# Patient Record
Sex: Male | Born: 1985 | Race: Black or African American | Hispanic: No | Marital: Single | Smoking: Never smoker
Health system: Southern US, Community
[De-identification: ages and names within clinical notes are randomized; demographics above are authoritative.]

## PROBLEM LIST (undated history)

## (undated) DIAGNOSIS — M549 Dorsalgia, unspecified: Secondary | ICD-10-CM

## (undated) DIAGNOSIS — M5126 Other intervertebral disc displacement, lumbar region: Secondary | ICD-10-CM

## (undated) DIAGNOSIS — H00019 Hordeolum externum unspecified eye, unspecified eyelid: Secondary | ICD-10-CM

## (undated) HISTORY — PX: MANDIBLE FRACTURE SURGERY: SHX706

---
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
## 2004-01-01 ENCOUNTER — Emergency Department (HOSPITAL_COMMUNITY): Admission: EM | Admit: 2004-01-01 | Discharge: 2004-01-01 | Payer: Self-pay | Admitting: Family Medicine

## 2004-10-19 ENCOUNTER — Emergency Department (HOSPITAL_COMMUNITY): Admission: EM | Admit: 2004-10-19 | Discharge: 2004-10-19 | Payer: Self-pay | Admitting: *Deleted

## 2005-08-13 ENCOUNTER — Emergency Department (HOSPITAL_COMMUNITY): Admission: EM | Admit: 2005-08-13 | Discharge: 2005-08-13 | Payer: Self-pay | Admitting: Family Medicine

## 2006-06-09 ENCOUNTER — Observation Stay (HOSPITAL_COMMUNITY): Admission: AC | Admit: 2006-06-09 | Discharge: 2006-06-10 | Payer: Self-pay

## 2016-08-27 ENCOUNTER — Emergency Department (HOSPITAL_COMMUNITY)
Admission: EM | Admit: 2016-08-27 | Discharge: 2016-08-27 | Disposition: A | Discharge status: Home / Self Care | Payer: Self-pay | Attending: Emergency Medicine | Admitting: Emergency Medicine

## 2016-08-27 ENCOUNTER — Encounter (HOSPITAL_COMMUNITY): Payer: Self-pay | Admitting: Emergency Medicine

## 2016-08-27 DIAGNOSIS — M5441 Lumbago with sciatica, right side: Secondary | ICD-10-CM | POA: Insufficient documentation

## 2016-08-27 DIAGNOSIS — Z79899 Other long term (current) drug therapy: Secondary | ICD-10-CM | POA: Insufficient documentation

## 2016-08-27 HISTORY — DX: Dorsalgia, unspecified: M54.9

## 2016-08-27 MED ORDER — METHOCARBAMOL 500 MG PO TABS: 500.0000 mg | ORAL_TABLET | Freq: Two times a day (BID) | ORAL | 0 refills | Status: DC

## 2016-08-27 MED ORDER — DEXAMETHASONE SODIUM PHOSPHATE 10 MG/ML IJ SOLN
10.0000 mg | Freq: Once | INTRAMUSCULAR | Status: AC
  Administered 2016-08-27: 10 mg via INTRAMUSCULAR
  Filled 2016-08-27: qty 1

## 2016-08-27 MED ORDER — METHOCARBAMOL 500 MG PO TABS
500.0000 mg | ORAL_TABLET | Freq: Once | ORAL | Status: AC
Start: 2016-08-27 — End: 2016-08-27
  Administered 2016-08-27: 500 mg via ORAL
  Filled 2016-08-27: qty 1

## 2016-08-27 MED ORDER — METHYLPREDNISOLONE 4 MG PO TBPK: ORAL_TABLET | ORAL | 0 refills | Status: DC

## 2016-08-27 NOTE — Discharge Instructions (Signed)
Please read and follow all provided instructions.  Your diagnoses today include:  1. Acute right-sided low back pain with right-sided sciatica     Tests performed today include: Vital signs - see below for your results today  Medications prescribed:   Take any prescribed medications only as directed.  Home care instructions:  Follow any educational materials contained in this packet Please rest, use ice or heat on your back for the next several days Do not lift, push, pull anything more than 10 pounds for the next week  Follow-up instructions: Please follow-up with your primary care provider in the next 1 week for further evaluation of your symptoms.   Return instructions:  SEEK IMMEDIATE MEDICAL ATTENTION IF YOU HAVE: New numbness, tingling, weakness, or problem with the use of your arms or legs Severe back pain not relieved with medications Loss control of your bowels or bladder Increasing pain in any areas of the body (such as chest or abdominal pain) Shortness of breath, dizziness, or fainting.  Worsening nausea (feeling sick to your stomach), vomiting, fever, or sweats Any other emergent concerns regarding your health   Additional Information:  Your vital signs today were: BP 125/80 (BP Location: Right Arm)    Pulse 76    Temp 98.3 F (36.8 C)    Resp 18    SpO2 100%  If your blood pressure (BP) was elevated above 135/85 this visit, please have this repeated by your doctor within one month. --------------

## 2016-08-27 NOTE — ED Provider Notes (Signed)
[facility]-EMERGENCY DEPT Provider Note    By signing my name below, I, Alexander Mullins, attest that this documentation has been prepared under the direction and in the presence of Audry Piliyler Rees Matura, PA-C. Electronically Signed: Earmon PhoenixJennifer Mullins, ED Scribe. 08/27/16. 11:10 AM.    History   Chief Complaint Chief Complaint  Patient presents with  . Back Pain    HPI  Alexander Mullins is a 31 y.o. male who presents to the Emergency Department complaining of low back pain, right side greater than left, that began about two weeks ago. He reports associated pain that radiates from his lower back down the lateral RLE. He reports playing basketball and exacerbating the pain. He also reports moving from [place] around the same time which also exacerbated the pain. He states he initially hurt his back two years ago while playing basketball. He states he has had several episodes of back pain since. Pt states he was seen in the ED in [place] two weeks ago and received an injection of Decadron and course of Prednisone (finished about one week ago) which he states helped his pain. He reports sneezing and states all the pain returned. He has taken a Vicodin he received from his wife for pain with moderate relief. Movements increase his pain. He denies alleviating factors. He denies numbness, tingling or weakness of the lower extremities, bowel or bladder incontinence, saddle anesthesia. He states he has seen a chiropractor in the past and states he was told he had sciatica. He does not have a PCP or insurance currently.   Past Medical History:  Diagnosis Date  . Back pain     There are no active problems to display for this patient.   Past Surgical History:  Procedure Laterality Date  . MANDIBLE FRACTURE SURGERY       Home Medications    Prior to Admission medications   Medication Sig Start Date End Date Taking? Authorizing Provider  methocarbamol (ROBAXIN) 500 MG tablet Take 1 tablet (500 mg total) by mouth 2  (two) times daily. 08/27/16   Audry Piliyler Cassiopeia Florentino, PA-C  methylPREDNISolone (MEDROL DOSEPAK) 4 MG TBPK tablet Take as written 08/27/16   Audry Piliyler Elaura Calix, PA-C    Family History History reviewed. No pertinent family history.  Social History Social History  Substance Use Topics  . Smoking status: Not on file  . Smokeless tobacco: Not on file  . Alcohol use Not on file     Allergies   Patient has no allergy information on record.   Review of Systems Review of Systems  Constitutional: Negative for chills and fever.  Gastrointestinal:       No bowel or bladder incontinence  Musculoskeletal: Positive for back pain.  Neurological: Negative for weakness and numbness.     Physical Exam Updated Vital Signs BP 125/80 (BP Location: Right Arm)   Pulse 76   Temp 98.3 F (36.8 C)   Resp 18   SpO2 100%   Physical Exam  Constitutional: He is oriented to person, place, and time. Vital signs are normal. He appears well-developed and well-nourished.  HENT:  Head: Normocephalic and atraumatic.  Right Ear: Hearing normal.  Left Ear: Hearing normal.  Eyes: Conjunctivae and EOM are normal. Pupils are equal, round, and reactive to light.  Neck: Normal range of motion. Neck supple.  Cardiovascular: Normal rate and regular rhythm.   Pulmonary/Chest: Effort normal.  Musculoskeletal: Normal range of motion. He exhibits tenderness. He exhibits no edema or deformity.  Tenderness to palpation to right lower  back. No midline spine tenderness.  Neurological: He is alert and oriented to person, place, and time.  Skin: Skin is warm and dry.  Psychiatric: He has a normal mood and affect. His speech is normal and behavior is normal. Thought content normal.  Nursing note and vitals reviewed.  ED Treatments / Results  DIAGNOSTIC STUDIES: Oxygen Saturation is 100% on RA, normal by my interpretation.   COORDINATION OF CARE: 11:01 AM- Will treat with Medrol dose pack, Decadron injection, Motrin and Robaxin and  give referral to [facility] to establish care with a PCP. Encouraged pt to apply heat to area of pain. Pt verbalizes understanding and agrees to plan.  Medications  dexamethasone (DECADRON) injection 10 mg (not administered)  methocarbamol (ROBAXIN) tablet 500 mg (not administered)    Labs (all labs ordered are listed, but only abnormal results are displayed) Labs Reviewed - No data to display  EKG  EKG Interpretation None       Radiology No results found.  Procedures Procedures (including critical care time)  Medications Ordered in ED Medications  dexamethasone (DECADRON) injection 10 mg (not administered)  methocarbamol (ROBAXIN) tablet 500 mg (not administered)     Initial Impression / Assessment and Plan / ED Course  I have reviewed the triage vital signs and the nursing notes.  Pertinent labs & imaging results that were available during my care of the patient were reviewed by me and considered in my medical decision making (see chart for details).     Final Clinical Impressions(s) / ED Diagnoses  I have reviewed the relevant previous healthcare records. I obtained HPI from historian.  ED Course:  Assessment: Patient is a 30yM who presents to the ED with back pain x 2 weeks. Hx same. Seen in past for same and treated with steroids for likely herniated disc. No neurological deficits appreciated. Patient is ambulatory. No warning symptoms of back pain including: fecal incontinence, urinary retention or overflow incontinence, night sweats, waking from sleep with back pain, unexplained fevers or weight loss, h/o cancer, IVDU, recent trauma. No concern for cauda equina, epidural abscess, or other serious cause of back pain. Likely scaitic back pain. Treated with medrol dose pack and robaxin. Conservative measures such as rest, ice/heat and pain medicine indicated with PCP follow-up if no improvement with conservative management.   Disposition/Plan:  D/C  home Additional Verbal discharge instructions given and discussed with patient.  Pt Instructed to f/u with  and Wellness in the next week for evaluation and treatment of symptoms. Return precautions given Pt acknowledges and agrees with plan  Supervising Physician Charlynne Pander, MD  Final diagnoses:  Acute right-sided low back pain with right-sided sciatica   I personally performed the services described in this documentation, which was scribed in my presence. The recorded information has been reviewed and is accurate.    New Prescriptions New Prescriptions   METHOCARBAMOL (ROBAXIN) 500 MG TABLET    Take 1 tablet (500 mg total) by mouth 2 (two) times daily.   METHYLPREDNISOLONE (MEDROL DOSEPAK) 4 MG TBPK TABLET    Take as written     Audry Pili, PA-C 08/27/16 1115    Charlynne Pander, MD 08/27/16 [phone]

## 2016-08-27 NOTE — ED Triage Notes (Signed)
Pt c/o recurrent low back pain radiating down r/leg. Pt stated that he sneezed, 2 weeks ago. He reports sharp pain radiating down the r/leg. Pain unrelieved by OTC meds. Took 1 percocet last night that decreased pain slightly. Pt is unable to walk due to pain

## 2016-12-26 ENCOUNTER — Other Ambulatory Visit (HOSPITAL_COMMUNITY): Payer: Self-pay | Admitting: Nurse Practitioner

## 2016-12-26 DIAGNOSIS — M5416 Radiculopathy, lumbar region: Secondary | ICD-10-CM

## 2016-12-30 ENCOUNTER — Ambulatory Visit (HOSPITAL_COMMUNITY)
Admission: RE | Admit: 2016-12-30 | Discharge: 2016-12-30 | Disposition: A | Discharge status: Home / Self Care | Payer: Self-pay | Source: Ambulatory Visit | Attending: Nurse Practitioner | Admitting: Nurse Practitioner

## 2016-12-30 DIAGNOSIS — M5416 Radiculopathy, lumbar region: Secondary | ICD-10-CM

## 2016-12-30 DIAGNOSIS — M5126 Other intervertebral disc displacement, lumbar region: Secondary | ICD-10-CM | POA: Insufficient documentation

## 2016-12-30 DIAGNOSIS — M4726 Other spondylosis with radiculopathy, lumbar region: Secondary | ICD-10-CM | POA: Insufficient documentation

## 2016-12-30 DIAGNOSIS — M5116 Intervertebral disc disorders with radiculopathy, lumbar region: Secondary | ICD-10-CM | POA: Insufficient documentation

## 2017-02-01 ENCOUNTER — Encounter (INDEPENDENT_AMBULATORY_CARE_PROVIDER_SITE_OTHER): Payer: Self-pay | Admitting: Physician Assistant

## 2017-02-01 ENCOUNTER — Ambulatory Visit (INDEPENDENT_AMBULATORY_CARE_PROVIDER_SITE_OTHER): Payer: Self-pay | Admitting: Physician Assistant

## 2017-02-01 VITALS — BP 117/76 | HR 54 | Temp 98.2°F | Wt 129.2 lb

## 2017-02-01 DIAGNOSIS — M5137 Other intervertebral disc degeneration, lumbosacral region: Secondary | ICD-10-CM

## 2017-02-01 DIAGNOSIS — G8929 Other chronic pain: Secondary | ICD-10-CM

## 2017-02-01 DIAGNOSIS — M5441 Lumbago with sciatica, right side: Secondary | ICD-10-CM

## 2017-02-01 MED ORDER — GABAPENTIN 300 MG PO CAPS: 300.0000 mg | ORAL_CAPSULE | Freq: Three times a day (TID) | ORAL | 3 refills | Status: DC

## 2017-02-01 MED ORDER — KETOROLAC TROMETHAMINE 60 MG/2ML IM SOLN
60.0000 mg | Freq: Once | INTRAMUSCULAR | Status: AC
  Administered 2017-02-01: 60 mg via INTRAMUSCULAR

## 2017-02-01 MED ORDER — PREDNISONE 20 MG PO TABS: 60.0000 mg | ORAL_TABLET | Freq: Every day | ORAL | 0 refills | Status: DC

## 2017-02-01 MED FILL — GABAPENTIN 300 MG CAPSULE: 300 | 30 days supply | Qty: 90 | Fill #0

## 2017-02-01 MED FILL — ?PREDNISONE 20MG TABLET: 20 | 5 days supply | Qty: 15 | Fill #0

## 2017-02-01 NOTE — Patient Instructions (Signed)
Degenerative Disk Disease Degenerative disk disease is a condition caused by the changes that occur in spinal disks as you grow older. Spinal disks are soft and compressible disks located between the bones of your spine (vertebrae). These disks act like shock absorbers. Degenerative disk disease can affect the whole spine. However, the neck and lower back are most commonly affected. Many changes can occur in the spinal disks with aging, such as:  The spinal disks may dry and shrink.  Small tears may occur in the tough, outer covering of the disk (annulus).  The disk space may become smaller due to loss of water.  Abnormal growths in the bone (spurs) may occur. This can put pressure on the nerve roots exiting the spinal canal, causing pain.  The spinal canal may become narrowed.  What increases the risk?  Being overweight.  Having a family history of degenerative disk disease.  Smoking.  There is increased risk if you are doing heavy lifting or have a sudden injury. What are the signs or symptoms? Symptoms vary from person to person and may include:  Pain that varies in intensity. Some people have no pain, while others have severe pain. The location of the pain depends on the part of your backbone that is affected. ? You will have neck or arm pain if a disk in the neck area is affected. ? You will have pain in your back, buttocks, or legs if a disk in the lower back is affected.  Pain that becomes worse while bending, reaching up, or with twisting movements.  Pain that may start gradually and then get worse as time passes. It may also start after a major or minor injury.  Numbness or tingling in the arms or legs.  How is this diagnosed? Your health care provider will ask you about your symptoms and about activities or habits that may cause the pain. He or she may also ask about any injuries, diseases, or treatments you have had. Your health care provider will examine you to check  for the range of movement that is possible in the affected area, to check for strength in your extremities, and to check for sensation in the areas of the arms and legs supplied by different nerve roots. You may also have:  An X-ray of the spine.  Other imaging tests, such as MRI.  How is this treated? Your health care provider will advise you on the best plan for treatment. Treatment may include:  Medicines.  Rehabilitation exercises.  Follow these instructions at home:  Follow proper lifting and walking techniques as advised by your health care provider.  Maintain good posture.  Exercise regularly as advised by your health care provider.  Perform relaxation exercises.  Change your sitting, standing, and sleeping habits as advised by your health care provider.  Change positions frequently.  Lose weight or maintain a healthy weight as advised by your health care provider.  Do not use any tobacco products, including cigarettes, chewing tobacco, or electronic cigarettes. If you need help quitting, ask your health care provider.  Wear supportive footwear.  Take medicines only as directed by your health care provider. Contact a health care provider if:  Your pain does not go away within 1-4 weeks.  You have significant appetite or weight loss. Get help right away if:  Your pain is severe.  You notice weakness in your arms, hands, or legs.  You begin to lose control of your bladder or bowel movements.  You have   fevers or night sweats. This information is not intended to replace advice given to you by your health care provider. Make sure you discuss any questions you have with your health care provider. Document Released: 04/17/2007 Document Revised: 11/26/2015 Document Reviewed: 10/22/2013 Elsevier Interactive Patient Education  2018 Elsevier Inc.  
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
## 2017-05-09 ENCOUNTER — Ambulatory Visit (INDEPENDENT_AMBULATORY_CARE_PROVIDER_SITE_OTHER): Payer: Self-pay | Admitting: Physician Assistant

## 2017-05-15 ENCOUNTER — Ambulatory Visit: Payer: Self-pay

## 2018-02-25 IMAGING — MR MR LUMBAR SPINE W/O CM
4 of 5 series · 19 of 48 positions shown · non-contrast
Comparison: None.

CLINICAL DATA: Low back pain with left leg pain and weakness.
Basketball injury 2 years ago.

EXAM:
MRI LUMBAR SPINE WITHOUT CONTRAST
TECHNIQUE: Multiplanar, multisequence MR imaging of the lumbar spine was
performed. No intravenous contrast was administered.

[Series 3: T1 · sagittal · 4.0mm · 0.51mm/px · 3 of 14 slices shown (1 of 2)]
[im 3/14]
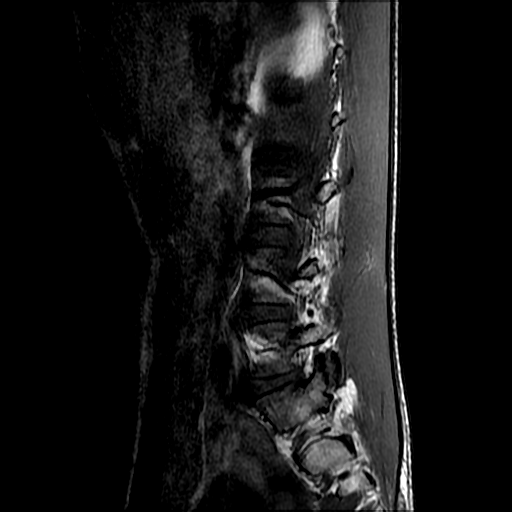
[im 8/14]
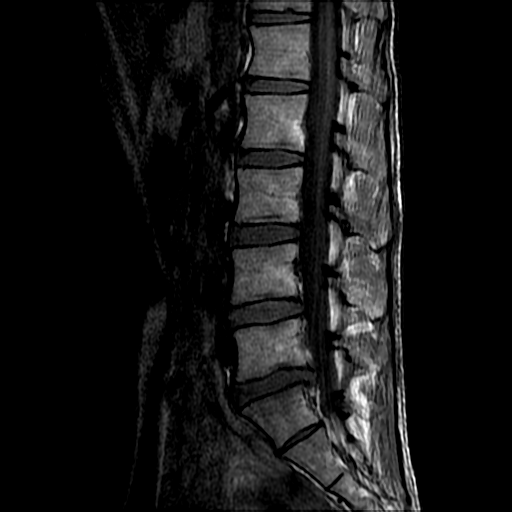
[im 14/14]
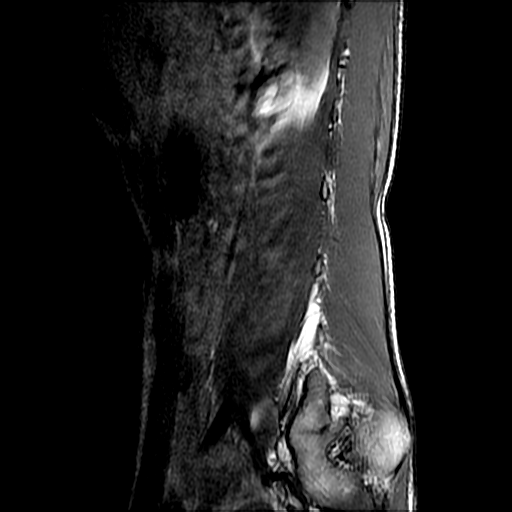

[Series 4: T2 post-contrast · sagittal · 4.0mm · 0.51mm/px · 6 of 14 slices shown]
[im 1/14]
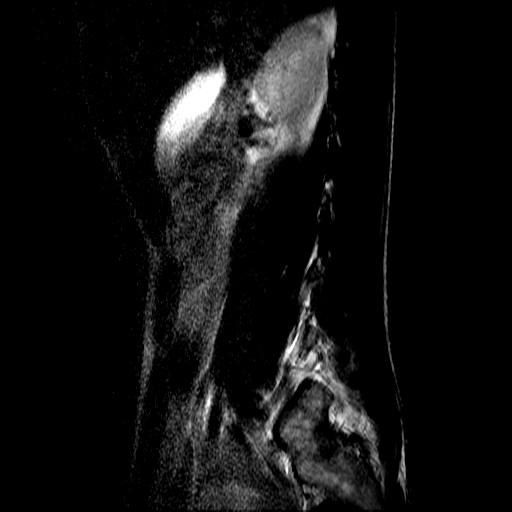
[im 3/14]
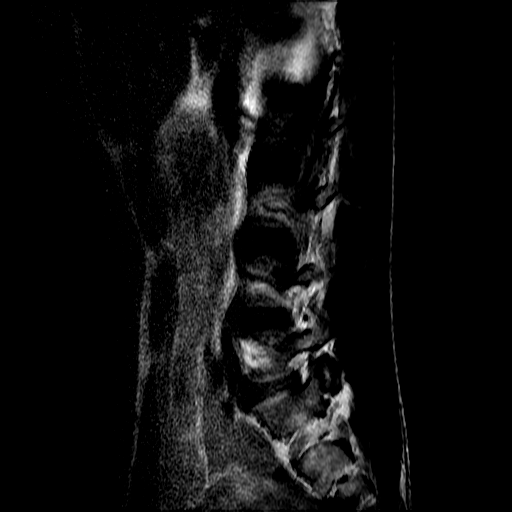
[im 6/14]
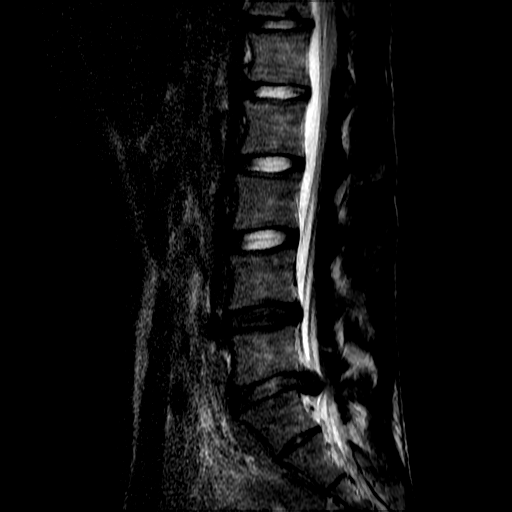
[im 8/14]
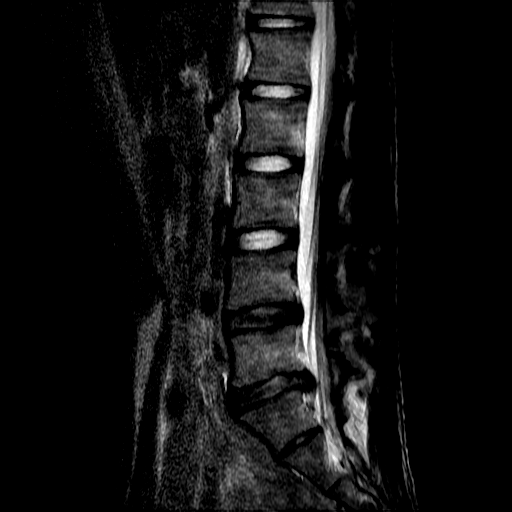
[im 11/14]
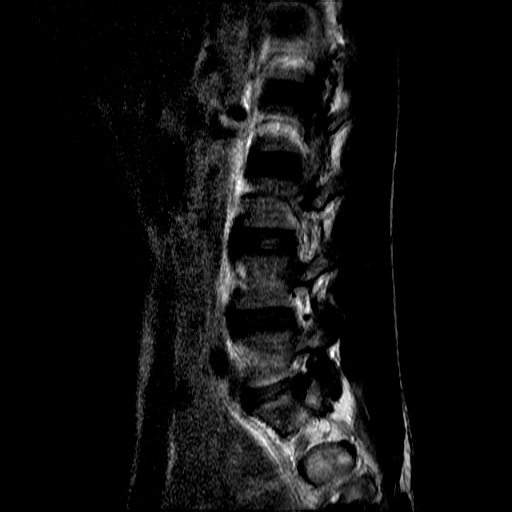
[im 14/14]
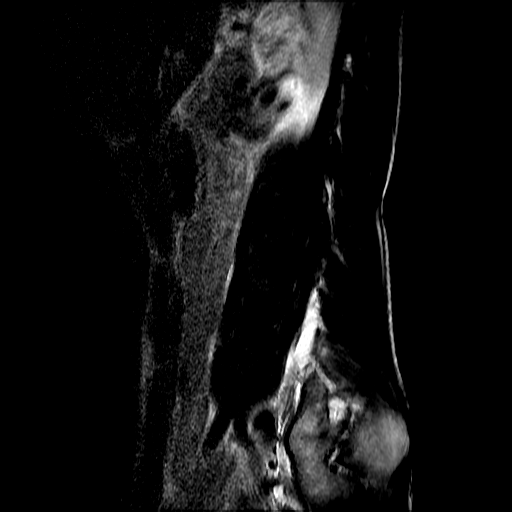

[Series 6: T2 · axial · 4.0mm · 0.39mm/px · z∈[-86,+78]mm · 7 of 34 slices shown]
[im 1/34]
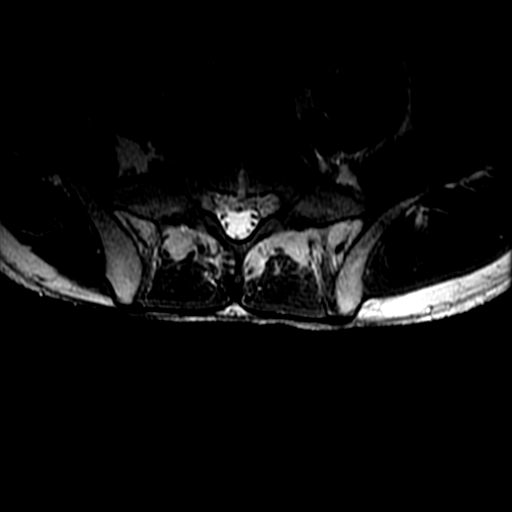
[im 5/34]
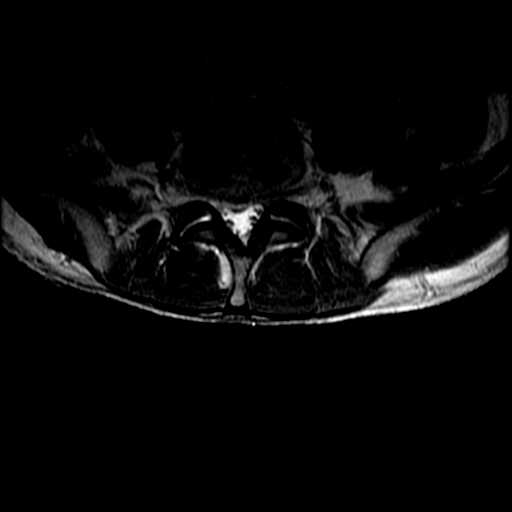
[im 10/34]
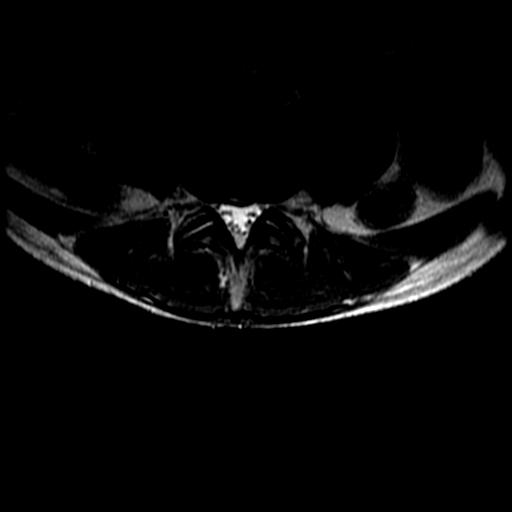
[im 15/34]
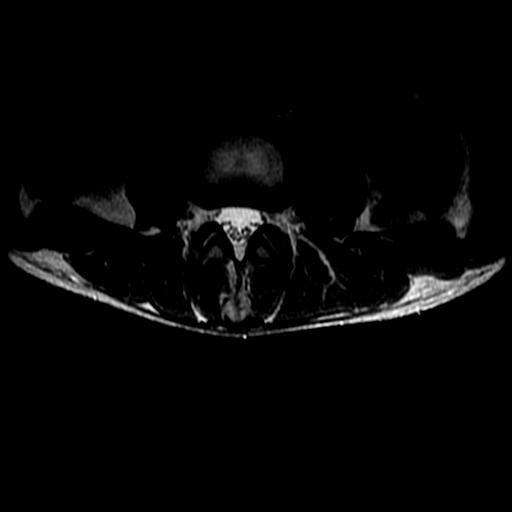
[im 17/34]
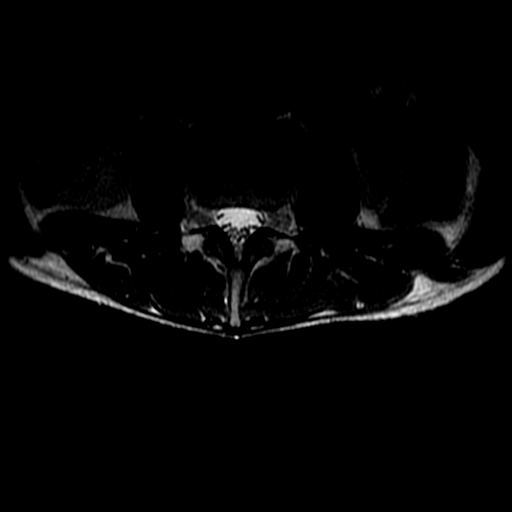
[im 19/34]
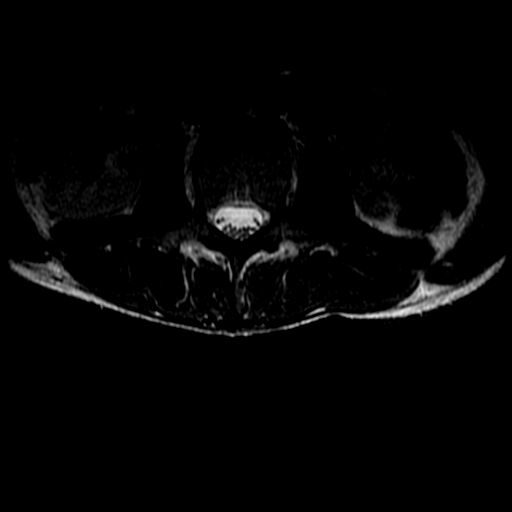
[im 29/34]
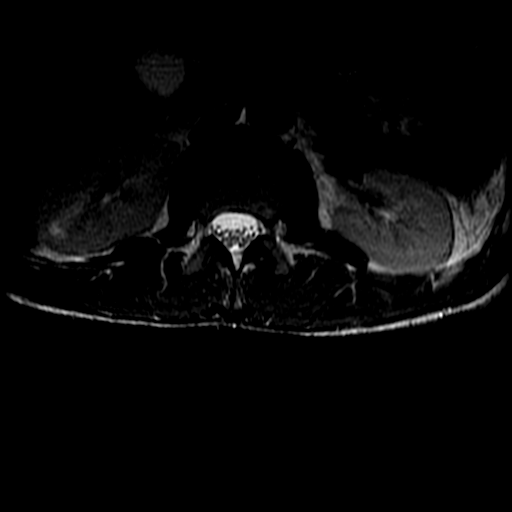

[Series 7: T1 · axial · 4.0mm · 0.39mm/px · z∈[-66,+78]mm · 3 of 34 slices shown (2 of 2)]
[im 5/34]
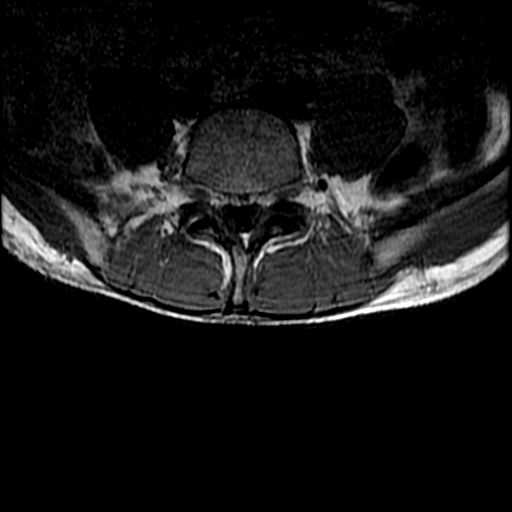
[im 17/34]
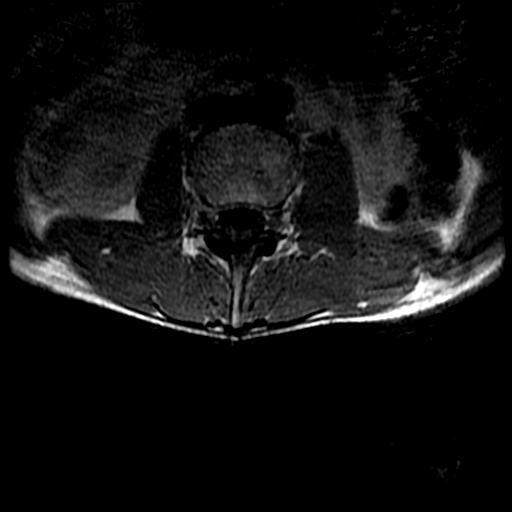
[im 29/34]
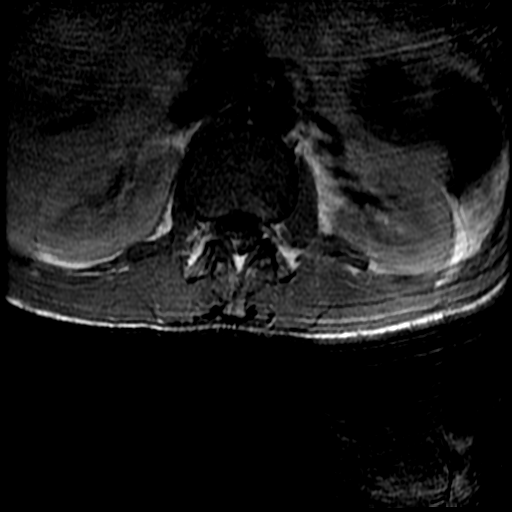

[19 of 48 positions shown; findings below may reference images not displayed]

FINDINGS: Despite efforts by the technologist and patient, motion artifact is
present on today's exam and could not be eliminated. This reduces
exam sensitivity and specificity.

Segmentation: The lowest lumbar type non-rib-bearing vertebra is
labeled as L5.

Alignment:  3 mm degenerative retrolisthesis at the L5-S1 level.

Vertebrae: Type 1 degenerative endplate findings at L5-S1, with disc
desiccation at L4-5 and L5-S1. Subtle perifacet edema is suspected
on the left at L5-S1. No pars defects identified.

Conus medullaris: Extends to the upper L2 level and appears normal.

Paraspinal and other soft tissues: Unremarkable

Disc levels:

L1-2: Unremarkable.

L2- 3:  Unremarkable.

L3-4: Unremarkable.

L4-5: Mild bilateral subarticular lateral recess stenosis and
borderline central narrowing of the thecal sac due to central disc
protrusion and disc bulge.

L5-S1: Prominent right and mild left subarticular lateral recess
stenosis with mild bilateral foraminal stenosis and moderate right
eccentric central narrowing of the thecal sac due to a disc level
right paracentral and lateral recess disc protrusion which extends
caudad along with mild disc bulge and facet arthropathy.
IMPRESSION: 1. Lumbar spondylosis and degenerative disc disease, causing
prominent impingement at L5-S1, and mild impingement at L4-5, as
detailed above.

## 2019-01-03 ENCOUNTER — Other Ambulatory Visit: Payer: Self-pay

## 2019-01-03 ENCOUNTER — Ambulatory Visit (INDEPENDENT_AMBULATORY_CARE_PROVIDER_SITE_OTHER): Payer: BLUE CROSS/BLUE SHIELD | Admitting: Family Medicine

## 2019-01-03 ENCOUNTER — Encounter: Payer: Self-pay | Admitting: Family Medicine

## 2019-01-03 ENCOUNTER — Other Ambulatory Visit (HOSPITAL_COMMUNITY)
Admission: RE | Admit: 2019-01-03 | Discharge: 2019-01-03 | Disposition: A | Discharge status: Home / Self Care | Payer: BLUE CROSS/BLUE SHIELD | Source: Ambulatory Visit | Attending: Family Medicine | Admitting: Family Medicine

## 2019-01-03 VITALS — BP 110/64 | HR 62 | Temp 98.5°F | Ht 72.0 in | Wt 138.2 lb

## 2019-01-03 DIAGNOSIS — Z113 Encounter for screening for infections with a predominantly sexual mode of transmission: Secondary | ICD-10-CM

## 2019-01-03 DIAGNOSIS — Z131 Encounter for screening for diabetes mellitus: Secondary | ICD-10-CM | POA: Diagnosis not present

## 2019-01-03 DIAGNOSIS — L720 Epidermal cyst: Secondary | ICD-10-CM

## 2019-01-03 DIAGNOSIS — Z0001 Encounter for general adult medical examination with abnormal findings: Secondary | ICD-10-CM

## 2019-01-03 DIAGNOSIS — M549 Dorsalgia, unspecified: Secondary | ICD-10-CM | POA: Diagnosis not present

## 2019-01-03 DIAGNOSIS — Z13220 Encounter for screening for lipoid disorders: Secondary | ICD-10-CM

## 2019-01-03 LAB — LIPID PANEL
Cholesterol: 142 mg/dL (ref 0–200)
HDL: 52.9 mg/dL (ref >39.00)
LDL Cholesterol: 77 mg/dL (ref 0–99)
NonHDL: 89.5
Total CHOL/HDL Ratio: 3
Triglycerides: 64 mg/dL (ref 0.0–149.0)
VLDL: 12.8 mg/dL (ref 0.0–40.0)

## 2019-01-03 LAB — GLUCOSE, RANDOM: Glucose, Bld: 63 mg/dL — ABNORMAL LOW (ref 70–99)

## 2019-01-03 NOTE — Patient Instructions (Signed)
It was very nice to see you today!  I will place a referral for you to see the dermatologist and for you to see our sports medicine doctor.  We will check blood work and urine sample today.   Eat at least 3 REAL meals and 1-2 snacks per day.  Aim for no more than 5 hours between eating.  Eat breakfast within one hour of getting up.    Obtain twice as many fruits/vegetables as protein or carbohydrate foods for both lunch and dinner.   Cut down on sweet beverages. This includes juice, soda, and sweet tea.    Exercise at least 150 minutes every week.   Take care, Dr Jerline Pain   Preventive Care 48-28 Years Old, Male Preventive care refers to lifestyle choices and visits with your health care provider that can promote health and wellness. This includes:  A yearly physical exam. This is also called an annual well check.  Regular dental and eye exams.  Immunizations.  Screening for certain conditions.  Healthy lifestyle choices, such as eating a healthy diet, getting regular exercise, not using drugs or products that contain nicotine and tobacco, and limiting alcohol use. What can I expect for my preventive care visit? Physical exam Your health care provider will check:  Height and weight. These may be used to calculate body mass index (BMI), which is a measurement that tells if you are at a healthy weight.  Heart rate and blood pressure.  Your skin for abnormal spots. Counseling Your health care provider may ask you questions about:  Alcohol, tobacco, and drug use.  Emotional well-being.  Home and relationship well-being.  Sexual activity.  Eating habits.  Work and work Statistician. What immunizations do I need?  Influenza (flu) vaccine  This is recommended every year. Tetanus, diphtheria, and pertussis (Tdap) vaccine  You may need a Td booster every 10 years. Varicella (chickenpox) vaccine  You may need this vaccine if you have not already been vaccinated.  Human papillomavirus (HPV) vaccine  If recommended by your health care provider, you may need three doses over 6 months. Measles, mumps, and rubella (MMR) vaccine  You may need at least one dose of MMR. You may also need a second dose. Meningococcal conjugate (MenACWY) vaccine  One dose is recommended if you are 64-55 years old and a Market researcher living in a residence hall, or if you have one of several medical conditions. You may also need additional booster doses. Pneumococcal conjugate (PCV13) vaccine  You may need this if you have certain conditions and were not previously vaccinated. Pneumococcal polysaccharide (PPSV23) vaccine  You may need one or two doses if you smoke cigarettes or if you have certain conditions. Hepatitis A vaccine  You may need this if you have certain conditions or if you travel or work in places where you may be exposed to hepatitis A. Hepatitis B vaccine  You may need this if you have certain conditions or if you travel or work in places where you may be exposed to hepatitis B. Haemophilus influenzae type b (Hib) vaccine  You may need this if you have certain risk factors. You may receive vaccines as individual doses or as more than one vaccine together in one shot (combination vaccines). Talk with your health care provider about the risks and benefits of combination vaccines. What tests do I need? Blood tests  Lipid and cholesterol levels. These may be checked every 5 years starting at age 40.  Hepatitis C test.  Hepatitis B test. Screening   Diabetes screening. This is done by checking your blood sugar (glucose) after you have not eaten for a while (fasting).  Sexually transmitted disease (STD) testing. Talk with your health care provider about your test results, treatment options, and if necessary, the need for more tests. Follow these instructions at home: Eating and drinking   Eat a diet that includes fresh fruits and  vegetables, whole grains, lean protein, and low-fat dairy products.  Take vitamin and mineral supplements as recommended by your health care provider.  Do not drink alcohol if your health care provider tells you not to drink.  If you drink alcohol: ? Limit how much you have to 0-2 drinks a day. ? Be aware of how much alcohol is in your drink. In the [place] one drink equals one 12 oz bottle of beer (355 mL), one 5 oz glass of wine (148 mL), or one 1 oz glass of hard liquor (44 mL). Lifestyle  Take daily care of your teeth and gums.  Stay active. Exercise for at least 30 minutes on 5 or more days each week.  Do not use any products that contain nicotine or tobacco, such as cigarettes, e-cigarettes, and chewing tobacco. If you need help quitting, ask your health care provider.  If you are sexually active, practice safe sex. Use a condom or other form of protection to prevent STIs (sexually transmitted infections). What's next?  Go to your health care provider once a year for a well check visit.  Ask your health care provider how often you should have your eyes and teeth checked.  Stay up to date on all vaccines. This information is not intended to replace advice given to you by your health care provider. Make sure you discuss any questions you have with your health care provider. Document Released: 08/16/2001 Document Revised: 06/14/2018 Document Reviewed: 06/14/2018 Elsevier Patient Education  2020 [employer].

## 2019-01-03 NOTE — Progress Notes (Signed)
Chief Complaint:  Alexander Mullins is a 33 y.o. male who presents today for his annual comprehensive physical exam and to establish care.  Assessment/Plan:  Back Mullins No red flags.  Will place referral to sports medicine for further evaluation/management.  Epidermal cyst Will place referral to dermatology for excision.   Preventative Healthcare: Check lipid panel and fasting glucose.  Check HIV antibody and RPR.  Check urine cytology for gonorrhea, chlamydia, and trichomonas.  Patient Counseling(The following topics were reviewed and/or handout was given):  -Nutrition: Stressed importance of moderation in sodium/caffeine intake, saturated fat and cholesterol, caloric balance, sufficient intake of fresh fruits, vegetables, and fiber.  -Stressed the importance of regular exercise.   -Substance Abuse: Discussed cessation/primary prevention of tobacco, alcohol, or other drug use; driving or other dangerous activities under the influence; availability of treatment for abuse.   -Injury prevention: Discussed safety belts, safety helmets, smoke detector, smoking near bedding or upholstery.   -Sexuality: Discussed sexually transmitted diseases, partner selection, use of condoms, avoidance of unintended pregnancy and contraceptive alternatives.   -Dental health: Discussed importance of regular tooth brushing, flossing, and dental visits.  -Health maintenance and immunizations reviewed. Please refer to Health maintenance section.  Return to care in 1 year for next preventative visit.     Subjective:  HPI:  He has no acute complaints today.  He would like to be checked for STDs today.  Does not have any current symptoms.  He has a few chronic problems outlined below.  Cyst Located on his forehead.  Has been there for 1 to 2 years.  Initially seemed like a pimple in the area that he popped.  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  Over the past several months he has noticed a hard  mobile nodule in the area.  Occasionally painful.  No drainage.  No other treatments tried.  Chronic back Mullins secondary to herniated disc Several year history.  Injured it in adolescence and again several years ago while playing basketball.  Is not currently taking any medications for this.  He is been working on Product managerhome stretches/yoga and trying to eat an anti-inflammatory diet which helps.  He very occasionally takes ibuprofen as well.  Lifestyle Diet: Tries to eat a healthy balanced diet with not a lot of meat.  Exercise: Yoga stretches  Dep[facility] screen [facility] 01/03/2019  Decreased Interest 0  Down, Depressed, Hopeless 0  PHQ - 2 Score 0  Altered sleeping 1  Tired, decreased energy 0  Change in appetite 2  Feeling bad or failure about yourself  0  Trouble concentrating 0  Moving slowly or fidgety/restless 0  Suicidal thoughts 0  PHQ-9 Score 3  Difficult doing work/chores Not difficult at all    Health Maintenance Due  Topic Date Due  . HIV Screening  11/03/1999     ROS: Per HPI, otherwise a complete review of systems was negative.   PMH:  The following were reviewed and entered/updated in epic: Past Medical History:  Diagnosis Date  . Back Mullins   . Herniated disc, cervical    Patient Active Problem List   Diagnosis Date Noted  . Epidermal cyst 01/03/2019  . Back Mullins 01/03/2019   Past Surgical History:  Procedure Laterality Date  . MANDIBLE FRACTURE SURGERY      Family History  Problem Relation Age of Onset  . Cancer Neg Hx     Medications- reviewed and updated No current outpatient medications on file.   No  current facility-administered medications for this visit.     Allergies-reviewed and updated No Known Allergies  Social History   Socioeconomic History  . Marital status: Single    Spouse name: Not on file  . Number of children: Not on file  . Years of education: Not on file  . Highest education level: Not on file  Occupational History  .  Not on file  Social Needs  . Financial resource strain: Not on file  . Food insecurity    Worry: Not on file    Inability: Not on file  . Transportation needs    Medical: Not on file    Non-medical: Not on file  Tobacco Use  . Smoking status: Never Smoker  . Smokeless tobacco: Never Used  Substance and Sexual Activity  . Alcohol use: Yes  . Drug use: Never  . Sexual activity: Yes  Lifestyle  . Physical activity    Days per week: Not on file    Minutes per session: Not on file  . Stress: Not on file  Relationships  . Social Herbalist on phone: Not on file    Gets together: Not on file    Attends religious service: Not on file    Active member of club or organization: Not on file    Attends meetings of clubs or organizations: Not on file    Relationship status: Not on file  Other Topics Concern  . Not on file  Social History Narrative  . Not on file        Objective:  Physical Exam: BP 110/64 (BP Location: Left Arm, Patient Position: Sitting, Cuff Size: Normal)   Pulse 62   Temp 98.5 F (36.9 C) (Oral)   Ht 6' (1.829 m)   Wt 138 lb 4 oz (62.7 kg)   SpO2 99%   BMI 18.75 kg/m   Body mass index is 18.75 kg/m. Wt Readings from Last 3 Encounters:  01/03/19 138 lb 4 oz (62.7 kg)  02/01/17 129 lb 3.2 oz (58.6 kg)   Gen: NAD, resting comfortably HEENT: TMs normal bilaterally. OP clear. No thyromegaly noted.  CV: RRR with no murmurs appreciated Pulm: NWOB, CTAB with no crackles, wheezes, or rhonchi GI: Normal bowel sounds present. Soft, Nontender, Nondistended. MSK: no edema, cyanosis, or clubbing noted.  Thoracic paraspinal muscles tender to palpation. Skin: warm, dry.  2 small discrete cystic structures on midline forehead approximately 2 to 3 mm in diameter.  Freely mobile. Neuro: CN2-12 grossly intact. Strength 5/5 in upper and lower extremities. Reflexes symmetric and intact bilaterally.  Psych: Normal affect and thought content     Alexander M.  Alexander Pain, MD 01/03/2019 12:20 PM

## 2019-01-03 NOTE — Assessment & Plan Note (Signed)
No red flags.  Will place referral to sports medicine for further evaluation/management.

## 2019-01-03 NOTE — Assessment & Plan Note (Signed)
Will place referral to dermatology for excision.

## 2019-01-07 LAB — RPR: RPR Ser Ql: NONREACTIVE

## 2019-01-07 LAB — HIV ANTIBODY (ROUTINE TESTING W REFLEX): HIV 1&2 Ab, 4th Generation: NONREACTIVE

## 2019-01-08 LAB — URINE CYTOLOGY ANCILLARY ONLY
Chlamydia: NEGATIVE
Neisseria Gonorrhea: NEGATIVE
Trichomonas: NEGATIVE

## 2019-01-08 NOTE — Progress Notes (Signed)
Please inform patient of the following:  Blood work including STD tests are all normal.  Do not need of any further testing at this time.  Would like him to keep up the good work and we can recheck his blood work in 5 years.

## 2019-01-22 ENCOUNTER — Encounter: Payer: Self-pay | Admitting: Family Medicine

## 2019-02-22 DIAGNOSIS — L720 Epidermal cyst: Secondary | ICD-10-CM | POA: Diagnosis not present

## 2019-07-02 ENCOUNTER — Ambulatory Visit (INDEPENDENT_AMBULATORY_CARE_PROVIDER_SITE_OTHER): Payer: Self-pay | Admitting: Family Medicine

## 2019-07-02 DIAGNOSIS — M545 Low back pain, unspecified: Secondary | ICD-10-CM

## 2019-07-02 DIAGNOSIS — H00013 Hordeolum externum right eye, unspecified eyelid: Secondary | ICD-10-CM

## 2019-07-02 MED ORDER — POLYMYXIN B-TRIMETHOPRIM 10000-0.1 UNIT/ML-% OP SOLN: 2.0000 [drp] | OPHTHALMIC | 0 refills | Status: DC

## 2019-07-02 NOTE — Progress Notes (Signed)
   Chief Complaint:  Alexander Mullins is a 33 y.o. male who presents today for a virtual office visit with a chief complaint of back pain.   Assessment/Plan:  Back pain No red flags.  Will give work note/excuse.  Continued home stretches and over-the-counter analgesics.  Stye We will start Polytrim drops.  Recommend continue warm compresses.    Subjective:  HPI:  Back Pain Has a history of a herniated disc. Works as a Geophysicist/field seismologist for [employer].  Went on a prolonged car ride yesterday and had flareup of his pain.  Informed his company who told him that he needed a doctor's note to go back to work.  Pain is stable.  Intermittent nature.  Several year history.  Pain is his usual.  Has had some inflammation in his right eye for the last couple of weeks.  Tried using a warm compress with no significant improvement.   ROS: Per HPI  PMH: He reports that he has never smoked. He has never used smokeless tobacco. He reports current alcohol use. He reports that he does not use drugs.      Objective/Observations  Physical Exam: Gen: NAD, resting comfortably HEENT: Right eyelid slightly edematous and erythematous. Pulm: Normal work of breathing Neuro: Grossly normal, moves all extremities Psych: Normal affect and thought content  No results found for this or any previous visit (from the past 24 hour(s)).   Virtual Visit via Video   I connected with Alexander Mullins on 07/02/19 at  3:40 PM EST by a video enabled telemedicine application and verified that I am speaking with the correct person using two identifiers. The limitations of evaluation and management by telemedicine and the availability of in person appointments were discussed. The patient expressed understanding and agreed to proceed.   Patient location: Home Provider location: [place] participating in the virtual visit: Myself and Patient     Algis Greenhouse. Jerline Pain, MD 07/02/2019 3:27 PM

## 2019-07-17 ENCOUNTER — Other Ambulatory Visit: Payer: Self-pay

## 2019-07-18 ENCOUNTER — Ambulatory Visit (INDEPENDENT_AMBULATORY_CARE_PROVIDER_SITE_OTHER): Payer: BC Managed Care – PPO | Admitting: Family Medicine

## 2019-07-18 ENCOUNTER — Encounter: Payer: Self-pay | Admitting: Family Medicine

## 2019-07-18 VITALS — BP 128/74 | HR 76 | Temp 98.0°F | Ht 72.0 in | Wt 141.0 lb

## 2019-07-18 DIAGNOSIS — M549 Dorsalgia, unspecified: Secondary | ICD-10-CM

## 2019-07-18 DIAGNOSIS — H00013 Hordeolum externum right eye, unspecified eyelid: Secondary | ICD-10-CM

## 2019-07-18 MED ORDER — MELOXICAM 15 MG PO TABS: 15.0000 mg | ORAL_TABLET | Freq: Every day | ORAL | 0 refills | Status: DC

## 2019-07-18 NOTE — Progress Notes (Signed)
   Alexander Mullins is a 34 y.o. male who presents today for an office visit.  Assessment/Plan:  New/Acute Problems: Stye Place referral to ophthalmology given lack of improvement with conservative measures.  Chronic Problems Addressed Today: Back pain Worsened.  No current red flags.  [name] of his work contributing to flares.  Recommended he seek FMLA.  Will start meloxicam 15 mg daily.    Subjective:  HPI:  Patient had virtual visit couple weeks ago for back pain and right eye stye.  Both of these issues have gotten worse.  He has a longstanding issue with back pain.  Currently works as a Civil Service fast streamer for [employer].  He was able to switch to short routes however has been lifting heavier objects up multiple flights of stairs.  He has been using drops for his eyes but has not had any improvement.  No reported fevers or chills.  No reported weakness or numbness.       Objective:  Physical Exam: BP 128/74   Pulse 76   Temp 98 F (36.7 C)   Ht 6' (1.829 m)   Wt 141 lb (64 kg)   SpO2 96%   BMI 19.12 kg/m   Gen: No acute distress, resting comfortably CV: Regular rate and rhythm with no murmurs appreciated Pulm: Normal work of breathing, clear to auscultation bilaterally with no crackles, wheezes, or rhonchi Neuro: Grossly normal, moves all extremities Psych: Normal affect and thought content      Alexander Mullins M. Jimmey Ralph, MD 07/18/2019 9:27 AM

## 2019-07-18 NOTE — Patient Instructions (Addendum)
It was very nice to see you today!  Please start the meloxicam.  Please talk to your employer about FMLA.  I will place a referral for you to see the eye doctor.  Take care, Dr Jimmey Ralph  Please try these tips to maintain a healthy lifestyle:   Eat at least 3 REAL meals and 1-2 snacks per day.  Aim for no more than 5 hours between eating.  If you eat breakfast, please do so within one hour of getting up.    Each meal should contain half fruits/vegetables, one quarter protein, and one quarter carbs (no bigger than a computer mouse)   Cut down on sweet beverages. This includes juice, soda, and sweet tea.     Drink at least 1 glass of water with each meal and aim for at least 8 glasses per day   Exercise at least 150 minutes every week.   b

## 2019-07-18 NOTE — Assessment & Plan Note (Signed)
Worsened.  No current red flags.  [name] of his work contributing to flares.  Recommended he seek FMLA.  Will start meloxicam 15 mg daily.

## 2019-09-14 ENCOUNTER — Other Ambulatory Visit: Payer: Self-pay

## 2019-09-14 ENCOUNTER — Encounter (HOSPITAL_COMMUNITY): Payer: Self-pay | Admitting: *Deleted

## 2019-09-14 ENCOUNTER — Ambulatory Visit (HOSPITAL_COMMUNITY)
Admission: EM | Admit: 2019-09-14 | Discharge: 2019-09-14 | Disposition: A | Discharge status: Home / Self Care | Payer: BC Managed Care – PPO | Attending: Family Medicine | Admitting: Family Medicine

## 2019-09-14 DIAGNOSIS — M545 Low back pain, unspecified: Secondary | ICD-10-CM

## 2019-09-14 DIAGNOSIS — G8929 Other chronic pain: Secondary | ICD-10-CM | POA: Diagnosis not present

## 2019-09-14 HISTORY — DX: Other intervertebral disc displacement, lumbar region: M51.26

## 2019-09-14 HISTORY — DX: Hordeolum externum unspecified eye, unspecified eyelid: H00.019

## 2019-09-14 MED ORDER — HYDROCODONE-ACETAMINOPHEN 5-325 MG PO TABS: 1.0000 | ORAL_TABLET | Freq: Four times a day (QID) | ORAL | 0 refills | Status: AC | PRN

## 2019-09-14 MED ORDER — PREDNISONE 50 MG PO TABS: ORAL_TABLET | ORAL | 1 refills | Status: DC

## 2019-09-14 NOTE — ED Provider Notes (Signed)
[facility]    CSN: [id number] Arrival date & time: 09/14/19  1626      History   Chief Complaint Chief Complaint  Patient presents with  . Back Pain    HPI Alexander Mullins is a 34 y.o. male.   This is a 34 year old man presenting for his initial visit to [facility] urgent care.  He is complaining of back pain.  Patient has a known ruptured disc at L4-5.  He works as a Patent examiner.  Yesterday he was driving in his seat that was quite awkward and he started to have back pain.  He does not have any radiculopathy at this point and has no weakness or numbness in his right leg.  Nevertheless his right flank is sore and he had trouble sleeping last night.  Is been no fever and no difficulty going to the bathroom.  Is been no saddle anesthesia.     Past Medical History:  Diagnosis Date  . Back pain   . Lumbar herniated disc   . Stye     Patient Active Problem List   Diagnosis Date Noted  . Epidermal cyst 01/03/2019  . Back pain 01/03/2019    Past Surgical History:  Procedure Laterality Date  . MANDIBLE FRACTURE SURGERY         Home Medications    Prior to Admission medications   Medication Sig Start Date End Date Taking? Authorizing Provider  HYDROcodone-acetaminophen (NORCO) 5-325 MG tablet Take 1 tablet by mouth every 6 (six) hours as needed for moderate pain. 09/14/19   Elvina Sidle, MD  predniSONE (DELTASONE) 50 MG tablet One daily with food 09/14/19   Elvina Sidle, MD    Family History Family History  Problem Relation Age of Onset  . Hypertension Mother   . Cancer Neg Hx     Social History Social History   Tobacco Use  . Smoking status: Never Smoker  . Smokeless tobacco: Never Used  Substance Use Topics  . Alcohol use: Yes    Comment: 3x/wk  . Drug use: Never     Allergies   Patient has no known allergies.   Review of Systems Review of Systems  Constitutional: Negative.   Musculoskeletal: Positive  for back pain. Negative for arthralgias.  Neurological: Negative.   All other systems reviewed and are negative.    Physical Exam Triage Vital Signs ED Triage Vitals  Enc Vitals Group     BP 09/14/19 1740 (!) 139/96     Pulse Rate 09/14/19 1740 (!) 50     Resp 09/14/19 1740 16     Temp 09/14/19 1740 98.3 F (36.8 C)     Temp Source 09/14/19 1740 Oral     SpO2 09/14/19 1740 100 %     Weight --      Height --      Head Circumference --      Peak Flow --      Pain Score 09/14/19 1737 5     Pain Loc --      Pain Edu? --      Excl. in GC? --    No data found.  Updated Vital Signs BP (!) 139/96   Pulse (!) 50   Temp 98.3 F (36.8 C) (Oral)   Resp 16   SpO2 100%    Physical Exam Vitals and nursing note reviewed.  Constitutional:      Appearance: Normal appearance. He is normal weight.  HENT:  Nose: Nose normal.  Eyes:     Conjunctiva/sclera: Conjunctivae normal.     Comments: Stye right upper lid  Cardiovascular:     Rate and Rhythm: Normal rate.  Pulmonary:     Effort: Pulmonary effort is normal.  Musculoskeletal:        General: No tenderness or deformity. Normal range of motion.     Cervical back: Normal range of motion and neck supple.     Comments: Normal straight leg raising No muscle asymmetry of LE's  Skin:    General: Skin is warm.  Neurological:     General: No focal deficit present.     Mental Status: He is alert and oriented to person, place, and time.  Psychiatric:        Mood and Affect: Mood normal.        Behavior: Behavior normal.      UC Treatments / Results  Labs (all labs ordered are listed, but only abnormal results are displayed) Labs Reviewed - No data to display  EKG   Radiology No results found.  Procedures Procedures (including critical care time)  Medications Ordered in UC Medications - No data to display  Initial Impression / Assessment and Plan / UC Course  I have reviewed the triage vital signs and the  nursing notes.  Pertinent labs & imaging results that were available during my care of the patient were reviewed by me and considered in my medical decision making (see chart for details).    Final Clinical Impressions(s) / UC Diagnoses   Final diagnoses:  Chronic right-sided low back pain without sciatica   Discharge Instructions   None    ED Prescriptions    Medication Sig Dispense Auth. Provider   predniSONE (DELTASONE) 50 MG tablet One daily with food 5 tablet Robyn Haber, MD   HYDROcodone-acetaminophen (NORCO) 5-325 MG tablet Take 1 tablet by mouth every 6 (six) hours as needed for moderate pain. 12 tablet Robyn Haber, MD     I have reviewed the PDMP during this encounter.   Robyn Haber, MD 09/14/19 1757

## 2019-09-14 NOTE — ED Triage Notes (Signed)
Reports flare-up of lumbar back pain with hx herniated disc, onset yesterday.  C/o pain radiating into left buttock.  Denies any parasthesias.

## 2019-09-16 ENCOUNTER — Telehealth: Payer: Self-pay | Admitting: Family Medicine

## 2019-09-16 NOTE — Telephone Encounter (Signed)
Spoke with patient he went to ED symptoms improved he will follow up if needed

## 2019-09-16 NOTE — Telephone Encounter (Signed)
Noted.  [name]. Jimmey Ralph, MD 09/16/2019 1:29 PM

## 2019-09-16 NOTE — Telephone Encounter (Signed)
Chief Complaint Back Injury Reason for Call Symptomatic / Request for Health Information Initial Comment Caller states he went to work yesterday, herniated disc history, back is hurting, and wondering what to do. Wondering about taking a rest day from work, and needing a note if so. Translation No Nurse Assessment Nurse: May, RN, Tammy Date/Time Alexander Mullins Time): 09/14/2019 7:07:08 AM Confirm and document reason for call. If symptomatic, describe symptoms. ---Caller states he went to work yesterday and now his back is hurting again. Caller states he has a herniated disc. Has the patient had close contact with a person known or suspected to have the novel coronavirus illness OR traveled / lives in area with major community spread (including international travel) in the last 14 days from the onset of symptoms? * If Asymptomatic, screen for exposure and travel within the last 14 days. ---No Does the patient have any new or worsening symptoms? ---Yes Will a triage be completed? ---Yes Related visit to physician within the last 2 weeks? ---No Does the PT have any chronic conditions? (i.e. diabetes, asthma, this includes High risk factors for pregnancy, etc.) ---Yes List chronic conditions. ---herniated disc Is this a behavioral health or substance abuse call? ---No Guidelines Guideline Title Affirmed Question Affirmed Notes Nurse Date/Time (Eastern Time) Back Pain Weakness of a leg or foot (e.g., unable to bear weight, dragging foot) May, RN, Tammy 09/14/2019 7:08:22 AM Disp. Time Alexander Mullins Time) Disposition Final User PLEASE NOTE: All timestamps contained within this report are represented as [place] Standard Time. CONFIDENTIALTY NOTICE: This fax transmission is intended only for the addressee. It contains information that is legally privileged, confidential or otherwise protected from use or disclosure. If you are not the intended recipient, you are strictly prohibited from reviewing,  disclosing, copying using or disseminating any of this information or taking any action in reliance on or regarding this information. If you have received this fax in error, please notify us immediately by telephone so that we can arrange for its return to [place]. Phone: [phone], Toll-Free: [phone], Fax: [phone] Page: 2 of 2 Call Id: [id number] 09/14/2019 7:10:58 AM Go to ED Now (or PCP triage) Yes May, RN, Tammy

## 2019-09-19 DIAGNOSIS — H00022 Hordeolum internum right lower eyelid: Secondary | ICD-10-CM | POA: Diagnosis not present

## 2019-09-19 DIAGNOSIS — H00021 Hordeolum internum right upper eyelid: Secondary | ICD-10-CM | POA: Diagnosis not present

## 2019-09-23 ENCOUNTER — Ambulatory Visit (INDEPENDENT_AMBULATORY_CARE_PROVIDER_SITE_OTHER): Payer: BLUE CROSS/BLUE SHIELD | Admitting: Family Medicine

## 2019-09-23 DIAGNOSIS — M545 Low back pain, unspecified: Secondary | ICD-10-CM

## 2019-09-23 DIAGNOSIS — G8929 Other chronic pain: Secondary | ICD-10-CM | POA: Diagnosis not present

## 2019-09-23 NOTE — Progress Notes (Signed)
   Alexander Mullins is a 34 y.o. male who presents today for a virtual office visit.  Assessment/Plan:  New/Acute Problems: Cough Patient will get Covid tested no does not have any sort of fevers or chills.  Cough is improving.  Discussed reasons return to care.  Chronic Problems Addressed Today: Back pain Worsened.  Will place referral to sports medicine.  Advised him to take prednisone and was was prescribed to him a week ago in urgent care.  Will give work excuse for the next week.     Subjective:  HPI:  Patient pulled his lower back this morning.  He has been ill with a cough for the past couple of days.  He coughed this morning with his legs in awkward position and felt like a weak sensation in his lower back.  This is radiated into his legs.  This is consistent with prior flares of his chronic low back pain.  Father was diagnosed with Covid several days ago.  He saw his father 2 days ago.  No fevers or chills.  Cough is improving.  No specific treatments tried for his back pain.       Objective/Observations  Physical Exam: Gen: NAD, resting comfortably Pulm: Normal work of breathing Neuro: Grossly normal, moves all extremities Psych: Normal affect and thought content  Virtual Visit via Video   I connected with Alexander Mullins on 09/23/19 at  3:40 PM EDT by a video enabled telemedicine application and verified that I am speaking with the correct person using two identifiers. The limitations of evaluation and management by telemedicine and the availability of in person appointments were discussed. The patient expressed understanding and agreed to proceed.   Patient location: Home Provider location: [place] [employer] Persons participating in the virtual visit: Myself and Patient     [name]. Jimmey Ralph, MD 09/23/2019 9:24 AM

## 2019-09-23 NOTE — Assessment & Plan Note (Signed)
Worsened.  Will place referral to sports medicine.  Advised him to take prednisone and was was prescribed to him a week ago in urgent care.  Will give work excuse for the next week.

## 2019-10-01 ENCOUNTER — Encounter: Payer: Self-pay | Admitting: Family Medicine

## 2019-10-01 ENCOUNTER — Telehealth: Payer: Self-pay

## 2019-10-01 NOTE — Telephone Encounter (Signed)
Letter placed patient notified

## 2019-10-01 NOTE — Telephone Encounter (Signed)
Patient states that he is still experiencing back pain. Patient would like to know how to he go about extending him time off out of work. Please follow up with patient

## 2019-10-06 ENCOUNTER — Emergency Department (HOSPITAL_COMMUNITY): Payer: BLUE CROSS/BLUE SHIELD

## 2019-10-06 ENCOUNTER — Emergency Department (HOSPITAL_COMMUNITY)
Admission: EM | Admit: 2019-10-06 | Discharge: 2019-10-06 | Disposition: A | Discharge status: Home / Self Care | Payer: BLUE CROSS/BLUE SHIELD | Attending: Emergency Medicine | Admitting: Emergency Medicine

## 2019-10-06 ENCOUNTER — Encounter (HOSPITAL_COMMUNITY): Payer: Self-pay

## 2019-10-06 DIAGNOSIS — Y9389 Activity, other specified: Secondary | ICD-10-CM | POA: Diagnosis not present

## 2019-10-06 DIAGNOSIS — Y9289 Other specified places as the place of occurrence of the external cause: Secondary | ICD-10-CM | POA: Diagnosis not present

## 2019-10-06 DIAGNOSIS — R519 Headache, unspecified: Secondary | ICD-10-CM | POA: Diagnosis not present

## 2019-10-06 DIAGNOSIS — S299XXA Unspecified injury of thorax, initial encounter: Secondary | ICD-10-CM | POA: Diagnosis not present

## 2019-10-06 DIAGNOSIS — Z23 Encounter for immunization: Secondary | ICD-10-CM | POA: Diagnosis not present

## 2019-10-06 DIAGNOSIS — Y999 Unspecified external cause status: Secondary | ICD-10-CM | POA: Diagnosis not present

## 2019-10-06 DIAGNOSIS — S0181XA Laceration without foreign body of other part of head, initial encounter: Secondary | ICD-10-CM | POA: Diagnosis not present

## 2019-10-06 DIAGNOSIS — S0990XA Unspecified injury of head, initial encounter: Secondary | ICD-10-CM | POA: Diagnosis not present

## 2019-10-06 DIAGNOSIS — S199XXA Unspecified injury of neck, initial encounter: Secondary | ICD-10-CM | POA: Diagnosis not present

## 2019-10-06 DIAGNOSIS — M542 Cervicalgia: Secondary | ICD-10-CM | POA: Diagnosis not present

## 2019-10-06 MED ORDER — LIDOCAINE-EPINEPHRINE (PF) 2 %-1:200000 IJ SOLN
20.0000 mL | Freq: Once | INTRAMUSCULAR | Status: AC
  Administered 2019-10-06: 20 mL
  Filled 2019-10-06: qty 20

## 2019-10-06 MED ORDER — IBUPROFEN 800 MG PO TABS: 800.0000 mg | ORAL_TABLET | Freq: Three times a day (TID) | ORAL | 0 refills | Status: AC

## 2019-10-06 MED ORDER — TETANUS-DIPHTH-ACELL PERTUSSIS 5-2.5-18.5 LF-MCG/0.5 IM SUSP
0.5000 mL | Freq: Once | INTRAMUSCULAR | Status: AC
  Administered 2019-10-06: 0.5 mL via INTRAMUSCULAR
  Filled 2019-10-06: qty 0.5

## 2019-10-06 MED ORDER — CYCLOBENZAPRINE HCL 10 MG PO TABS: 10.0000 mg | ORAL_TABLET | Freq: Two times a day (BID) | ORAL | 0 refills | Status: DC | PRN

## 2019-10-06 MED ORDER — ONDANSETRON HCL 4 MG/2ML IJ SOLN
4.0000 mg | Freq: Once | INTRAMUSCULAR | Status: AC
  Administered 2019-10-06: 4 mg via INTRAVENOUS
  Filled 2019-10-06: qty 2

## 2019-10-06 MED ORDER — BACITRACIN ZINC 500 UNIT/GM EX OINT
1.0000 "application " | TOPICAL_OINTMENT | Freq: Two times a day (BID) | CUTANEOUS | Status: DC
  Administered 2019-10-06: 1 via TOPICAL

## 2019-10-06 MED ORDER — MORPHINE SULFATE (PF) 4 MG/ML IV SOLN
4.0000 mg | Freq: Once | INTRAVENOUS | Status: AC
  Administered 2019-10-06: 4 mg via INTRAVENOUS
  Filled 2019-10-06: qty 1

## 2019-10-06 NOTE — ED Notes (Signed)
All wounds irrigated and cleaned.

## 2019-10-06 NOTE — ED Notes (Signed)
Pt resting in bed. Pt denies new or worsening complaints. Will continue to monitor. No distress noted. Pt on continuous monitoring via blood pressure, pulse ox, and cardiac monitor.  

## 2019-10-06 NOTE — ED Notes (Signed)
Pt verbalized understanding of d/c instructions, follow up care, wound care, s/s requiring return to ed and prescriptions. Pt had no additional questions at this time.
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
## 2019-10-07 ENCOUNTER — Ambulatory Visit: Payer: BLUE CROSS/BLUE SHIELD | Admitting: Family Medicine

## 2019-10-07 DIAGNOSIS — Z0289 Encounter for other administrative examinations: Secondary | ICD-10-CM
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
## 2019-10-10 ENCOUNTER — Telehealth: Payer: Self-pay | Admitting: Family Medicine

## 2019-10-10 ENCOUNTER — Other Ambulatory Visit: Payer: Self-pay

## 2019-10-10 NOTE — Telephone Encounter (Signed)
Please schedule appt for tomorrow morning slot

## 2019-10-10 NOTE — Telephone Encounter (Signed)
Patient is calling in requesting an appointment to have his stitches removed from his chin/above the eye, states he was supposed to have them for about 5 days and tomorrow marks the last day.

## 2019-10-11 ENCOUNTER — Encounter: Payer: Self-pay | Admitting: Family Medicine

## 2019-10-11 ENCOUNTER — Ambulatory Visit (INDEPENDENT_AMBULATORY_CARE_PROVIDER_SITE_OTHER): Payer: BLUE CROSS/BLUE SHIELD | Admitting: Family Medicine

## 2019-10-11 ENCOUNTER — Ambulatory Visit (INDEPENDENT_AMBULATORY_CARE_PROVIDER_SITE_OTHER): Payer: BLUE CROSS/BLUE SHIELD

## 2019-10-11 VITALS — BP 110/76 | HR 58 | Temp 98.3°F | Ht 72.0 in | Wt 143.2 lb

## 2019-10-11 DIAGNOSIS — G8929 Other chronic pain: Secondary | ICD-10-CM

## 2019-10-11 DIAGNOSIS — R6884 Jaw pain: Secondary | ICD-10-CM | POA: Diagnosis not present

## 2019-10-11 DIAGNOSIS — M545 Low back pain: Secondary | ICD-10-CM
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
## 2019-10-11 NOTE — Patient Instructions (Addendum)
It was very nice to see you today!  We removed your stitches today.  I will place a referral for you to see the oral surgeon to make sure that your jaw is not out of place.  We will order an MRI today.   Take care, Dr Jimmey Ralph  Please call back hopefully if stable Letter from these tips to maintain a healthy lifestyle:   Eat at least 3 REAL meals and 1-2 snacks per day.  Aim for no more than 5 hours between eating.  If you eat breakfast, please do so within one hour of getting up.    Each meal should contain half fruits/vegetables, one quarter protein, and one quarter carbs (no bigger than a computer mouse)   Cut down on sweet beverages. This includes juice, soda, and sweet tea.     Drink at least 1 glass of water with each meal and aim for at least 8 glasses per day   Exercise at least 150 minutes every week.
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
## 2019-10-14 ENCOUNTER — Encounter: Payer: Self-pay | Admitting: Family Medicine

## 2019-10-14 ENCOUNTER — Ambulatory Visit (INDEPENDENT_AMBULATORY_CARE_PROVIDER_SITE_OTHER): Payer: BLUE CROSS/BLUE SHIELD | Admitting: Family Medicine

## 2019-10-14 ENCOUNTER — Other Ambulatory Visit: Payer: Self-pay

## 2019-10-14 ENCOUNTER — Other Ambulatory Visit: Payer: Self-pay | Admitting: *Deleted

## 2019-10-14 VITALS — BP 120/70 | HR 71 | Ht 72.0 in | Wt 140.2 lb

## 2019-10-14 DIAGNOSIS — M5441 Lumbago with sciatica, right side: Secondary | ICD-10-CM

## 2019-10-14 DIAGNOSIS — G8929 Other chronic pain: Secondary | ICD-10-CM

## 2019-10-14 MED ORDER — GABAPENTIN 100 MG PO CAPS: 100.0000 mg | ORAL_CAPSULE | Freq: Every evening | ORAL | 1 refills | Status: AC | PRN
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
## 2019-10-14 NOTE — Progress Notes (Signed)
Please inform patient of the following:  Xray shows no fractures. Recommend he follow with sports medicine as we await his MRI results.  [name]. Jimmey Ralph, MD 10/14/2019 10:26 AM

## 2019-10-14 NOTE — Patient Instructions (Addendum)
Thank you for coming in today. Attend PT.  Try using a heating pad Try a TENS unit.     TENS UNIT: This is helpful for muscle pain and spasm.   Search and Purchase a TENS 7000 2nd edition at  www.tenspros.com or www.Amazon.com It should be less than $30.     TENS unit instructions: Do not shower or bathe with the unit on Turn the unit off before removing electrodes or batteries If the electrodes lose stickiness add a drop of water to the electrodes after they are disconnected from the unit and place on plastic sheet. If you continued to have difficulty, call the TENS unit company to purchase more electrodes. Do not apply lotion on the skin area prior to use. Make sure the skin is clean and dry as this will help prolong the life of the electrodes. After use, always check skin for unusual red areas, rash or other skin difficulties. If there are any skin problems, does not apply electrodes to the same area. Never remove the electrodes from the unit by pulling the wires. Do not use the TENS unit or electrodes other than as directed. Do not change electrode placement without consultating your therapist or physician. Keep 2 fingers with between each electrode. Wear time ratio is 2:1, on to off times.    For example on for 30 minutes off for 15 minutes and then on for 30 minutes off for 15 minutes

## 2019-11-09 ENCOUNTER — Other Ambulatory Visit: Payer: Self-pay

## 2019-11-09 ENCOUNTER — Ambulatory Visit
Admission: RE | Admit: 2019-11-09 | Discharge: 2019-11-09 | Disposition: A | Discharge status: Home / Self Care | Payer: BLUE CROSS/BLUE SHIELD | Source: Ambulatory Visit | Attending: Family Medicine | Admitting: Family Medicine

## 2019-11-09 DIAGNOSIS — M48061 Spinal stenosis, lumbar region without neurogenic claudication: Secondary | ICD-10-CM | POA: Diagnosis not present

## 2019-11-09 DIAGNOSIS — M545 Low back pain: Secondary | ICD-10-CM

## 2019-11-09 DIAGNOSIS — G8929 Other chronic pain: Secondary | ICD-10-CM

## 2019-11-12 NOTE — Progress Notes (Signed)
Please inform patient of the following:  MRI shows nerve impingement at L5-S1. Recommend he follow up with sports medicine as planned.  [name]. Jimmey Ralph, MD 11/12/2019 8:30 AM

## 2019-12-06 ENCOUNTER — Ambulatory Visit (INDEPENDENT_AMBULATORY_CARE_PROVIDER_SITE_OTHER): Payer: BLUE CROSS/BLUE SHIELD | Admitting: Family Medicine

## 2019-12-06 ENCOUNTER — Encounter: Payer: Self-pay | Admitting: Family Medicine

## 2019-12-06 VITALS — BP 112/62 | HR 65 | Temp 98.6°F | Ht 72.0 in | Wt 143.4 lb

## 2019-12-06 DIAGNOSIS — M549 Dorsalgia, unspecified: Secondary | ICD-10-CM

## 2019-12-06 DIAGNOSIS — M79645 Pain in left finger(s): Secondary | ICD-10-CM | POA: Diagnosis not present

## 2019-12-06 NOTE — Assessment & Plan Note (Signed)
Pain still uncontrolled.  MRI shows nerve root impingement.  He will need to follow-up with sports medicine.  Gave contact information and he will call them for an appointment.  Will write a work excuse for the next couple of weeks until he can follow-up with sports medicine.  He will continue gabapentin as needed.
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
## 2019-12-06 NOTE — Patient Instructions (Signed)
It was very nice to see you today!  Please follow-up with sports medicine soon.Phone: [phone]  You can try taping your little finger to your ring finger to help with pain.  You have a pinched nerve in your back.  You will need to follow-up with sports medicine for further management.  Take care, Dr Jimmey Ralph  Please try these tips to maintain a healthy lifestyle:   Eat at least 3 REAL meals and 1-2 snacks per day.  Aim for no more than 5 hours between eating.  If you eat breakfast, please do so within one hour of getting up.    Each meal should contain half fruits/vegetables, one quarter protein, and one quarter carbs (no bigger than a computer mouse)   Cut down on sweet beverages. This includes juice, soda, and sweet tea.     Drink at least 1 glass of water with each meal and aim for at least 8 glasses per day   Exercise at least 150 minutes every week.

## 2020-01-17 ENCOUNTER — Encounter: Payer: Self-pay | Admitting: Family Medicine

## 2020-01-17 ENCOUNTER — Ambulatory Visit (INDEPENDENT_AMBULATORY_CARE_PROVIDER_SITE_OTHER): Payer: Self-pay | Admitting: Family Medicine

## 2020-01-17 ENCOUNTER — Other Ambulatory Visit: Payer: Self-pay

## 2020-01-17 VITALS — BP 120/80 | HR 95 | Ht 72.0 in | Wt 136.0 lb

## 2020-01-17 DIAGNOSIS — M5441 Lumbago with sciatica, right side: Secondary | ICD-10-CM

## 2020-01-17 NOTE — Progress Notes (Signed)
Wynema Birch, am serving as a Neurosurgeon for Dr. Clementeen Graham.  Alexander Mullins is a 34 y.o. male who presents to [facility] at [facility] today for f/u of low back pain and L-spine MRI review.  He was last seen by Dr. Denyse Amass on 10/14/19 and was referred to PT and for an L-spine MRI and prescribed Gabapentin.  He never attended PT and had his L-spine MRI on 11/09/19 w/ no f/u after his MRI.  Since his last visit, pt reports he still feels the same. Patient having a hard time sleeping, sometimes will have to sleep on the floor so he can have his back flat. Patient can also not sit for too long as the back starts to hurt worse. Patient has not done PT and is wondering should he?  He notes that he is lost his job and thereby his health insurance in the interim and is in a real pickle about what to do next.  Diagnostic testing: L-spine MRI- 11/09/19; L-spine XR- 10/11/19  Pertinent review of systems: No fevers or chills  Relevant historical information: No previous significant medical problems.   Exam:  BP 120/80 (BP Location: Left Arm, Patient Position: Sitting, Cuff Size: Normal)   Pulse 95   Ht 6' (1.829 m)   Wt 136 lb (61.7 kg)   SpO2 99%   BMI 18.44 kg/m  General: Well Developed, well nourished, and in no acute distress.   MSK: L-spine: Normal-appearing nontender midline.  Normal lumbar motion.  Lower extremity strength is intact. Tender palpation greater trochanters bilaterally.    Lab and Radiology Results MR Lumbar Spine Wo Contrast  Result Date: 11/10/2019 CLINICAL DATA:  Initial evaluation for lower back pain with radiation into the lower extremities bilaterally, intermittent right lower extremity numbness. EXAM: MRI LUMBAR SPINE WITHOUT CONTRAST TECHNIQUE: Multiplanar, multisequence MR imaging of the lumbar spine was performed. No intravenous contrast was administered. COMPARISON:  Previous MRI from 12/30/2016. FINDINGS: Segmentation: Standard. Lowest well-formed  disc space labeled the L5-S1 level. Alignment: Straightening with mild reversal of the normal lumbar lordosis. No listhesis. Vertebrae: Vertebral body height maintained without evidence for acute or chronic fracture. Bone marrow signal intensity within normal limits. Few scattered benign hemangiomata noted. No worrisome osseous lesions. Discogenic reactive endplate changes with associated marrow edema present about the L5-S1 interspace, progressed from previous. No other abnormal marrow edema. Conus medullaris and cauda equina: Conus extends to the L1-2 level. Conus and cauda equina appear normal. Paraspinal and other soft tissues: Paraspinous soft tissues within normal limits. Subcentimeter simple cyst partially visualize within the upper right kidney. Visualized visceral structures otherwise unremarkable. Disc levels: T12-L1: Chronic endplate Schmorl's node deformity without significant disc bulge. No stenosis. L1-2:  Unremarkable. L2-3: Normal interspace. Mild left greater than right facet hypertrophy. No canal or foraminal stenosis. L3-4:  Unremarkable. L4-5: Diffuse disc bulge with disc desiccation. Superimposed central disc protrusion with associated annular fissure, slightly asymmetric to the right. Protruding disc mildly flattens the ventral thecal sac. Superimposed mild bilateral facet hypertrophy. Resultant mild canal with bilateral lateral recess stenosis. Mild bilateral L4 foraminal narrowing. L5-S1: Chronic intervertebral disc space narrowing with diffuse disc bulge and disc desiccation. Discogenic reactive endplate changes with associated reactive marrow edema. Superimposed central to right subarticular disc extrusion with inferior migration. Disc material encroaches upon the lateral recesses bilaterally, greater on the right, contacting and impinging upon both descending S1 nerve roots, also greater on the right. Small amount of disc material noted extending towards the  right S1 neural foramen (series  6, image 35). Resultant moderate canal with moderate to severe bilateral lateral recess stenosis. This as increased in size from previous. IMPRESSION: 1. Central/right subarticular disc extrusion with inferior migration at L5-S1, impinging upon both of the descending S1 nerve roots as they course through the lateral recesses, worse on the right. 2. Disc bulge with small central disc protrusion at L4-5 with resultant mild canal with bilateral lateral recess stenosis. Electronically Signed   By: Rise Mu M.D.   On: 11/10/2019 08:05   I, Clementeen Graham, personally (independently) visualized and performed the interpretation of the images attached in this note.     Assessment and Plan: 34 y.o. 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  Fortunately patient right now does not have severe sacral radiculopathy.  He does have some pain in the lateral hips that is probably more trochanteric bursitis secondary compensatory gait.  He is doing quite well with gabapentin fortunately.  Plan for home exercise program to work on his hip abductors.  Ideally he would benefit from physical therapy but he does not have health insurance and that is going to be a huge barrier.  Provided information on how to apply for [facility] health charity program and information for [facility] pro bono physical therapy clinic.  He may also benefit from epidural steroid injection in future as well.  Check back with me as needed.     Discussed warning signs or symptoms. Please see discharge instructions. Patient expresses understanding.   The above documentation has been reviewed and is accurate and complete Clementeen Graham, M.D.

## 2020-01-17 NOTE — Patient Instructions (Addendum)
Thank you for coming in today. Continue gabapentin for nerve pain.  Work on your hip abductors for trochanteric bursitis.  Apply for [facility] charity program. Also look into [facility] Keep me updated.  I think the hip issue is trochanteric bursitis.    Please perform the exercise program that we have prepared for you and gone over in detail on a daily basis.  In addition to the handout you were provided you can access your program through: [email or web address]   Your unique program code is:  [phone]

## 2020-01-23 ENCOUNTER — Other Ambulatory Visit: Payer: Self-pay

## 2020-01-23 ENCOUNTER — Telehealth: Payer: Self-pay

## 2020-01-23 DIAGNOSIS — M549 Dorsalgia, unspecified: Secondary | ICD-10-CM

## 2020-01-23 DIAGNOSIS — M545 Low back pain, unspecified: Secondary | ICD-10-CM

## 2020-01-23 NOTE — Telephone Encounter (Signed)
Pt was told by his sports med Dr that he would be a good candidate for PT. He is requesting a referral to see Leotis Shames

## 2020-01-23 NOTE — Telephone Encounter (Signed)
Referral placed.

## 2020-01-23 NOTE — Telephone Encounter (Signed)
Please see message and advise 

## 2020-01-23 NOTE — Telephone Encounter (Signed)
Ok with me. Please place any necessary orders. 

## 2020-02-06 ENCOUNTER — Ambulatory Visit (INDEPENDENT_AMBULATORY_CARE_PROVIDER_SITE_OTHER): Payer: Self-pay | Admitting: Physical Therapy

## 2020-02-06 ENCOUNTER — Encounter: Payer: Self-pay | Admitting: Physical Therapy

## 2020-02-06 ENCOUNTER — Telehealth: Payer: Self-pay | Admitting: Family Medicine

## 2020-02-06 ENCOUNTER — Other Ambulatory Visit: Payer: Self-pay

## 2020-02-06 DIAGNOSIS — M545 Low back pain, unspecified: Secondary | ICD-10-CM

## 2020-02-06 NOTE — Telephone Encounter (Signed)
Left patient vm to call back to schedule (TO2) follow up with Sedalia Muta for next Friday 02/14/20 or next available after that date.

## 2020-02-10 ENCOUNTER — Encounter: Payer: Self-pay | Admitting: Physical Therapy
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
## 2020-02-14 ENCOUNTER — Encounter: Payer: Self-pay | Admitting: Physical Therapy

## 2020-02-26 ENCOUNTER — Other Ambulatory Visit: Payer: Self-pay

## 2020-02-26 ENCOUNTER — Ambulatory Visit (INDEPENDENT_AMBULATORY_CARE_PROVIDER_SITE_OTHER): Payer: Self-pay | Admitting: Physical Therapy

## 2020-02-26 ENCOUNTER — Encounter: Payer: Self-pay | Admitting: Physical Therapy

## 2020-02-26 DIAGNOSIS — M545 Low back pain, unspecified: Secondary | ICD-10-CM

## 2020-02-26 NOTE — Patient Instructions (Signed)
Access Code: [email or web address] URL: [email or web address] Date: 02/26/2020 Prepared by: Sedalia Muta  Exercises Supine Figure 4 Piriformis Stretch - 2 x daily - 3 reps - 30 hold Seated Piriformis Stretch with Trunk Bend - 2 x daily - 3 reps - 30 hold Prone Hip Extension - 1 x daily - 2 sets - 10 reps Supine Bridge - 1 x daily - 2 sets - 10 reps

## 2020-02-26 NOTE — Therapy (Signed)
[facility] [address] [place], [place], [address] Phone: [phone]   Fax:  [phone]  Physical Therapy Treatment  Patient Details  Name: Alexander Mullins MRN: [id number] Date of Birth: [birth year] Referring Provider (PT): Clementeen Graham   Encounter Date: 02/26/2020   PT End of Session - 02/26/20 1211    Visit Number 2    Number of Visits 12    Date for PT Re-Evaluation 03/19/20    Authorization Type No insurance    PT Start Time [phone]    PT Stop Time 0930    PT Time Calculation (min) 44 min    Activity Tolerance Patient tolerated treatment well    Behavior During Therapy [facility] for tasks assessed/performed           Past Medical History:  Diagnosis Date  . Back pain   . Lumbar herniated disc   . Stye     Past Surgical History:  Procedure Laterality Date  . MANDIBLE FRACTURE SURGERY      There were no vitals filed for this visit.   Subjective Assessment - 02/26/20 0853    Subjective Pt last seen 8/5. States less pain at this time, does have pain in back bil, but pain in Leg is better.  Has been doing HEP. Also tested + for covid since last visit, states he feels fine, no issues now.    Patient Stated Goals decreased pain    Currently in Pain? Yes    Pain Score 4     Pain Location Back    Pain Orientation Right;Left    Pain Descriptors / Indicators Aching    Pain Type Acute pain    Pain Radiating Towards no pain down LE.    Pain Onset More than a month ago    Pain Frequency Intermittent                             [facility] Adult PT Treatment/Exercise - 02/26/20 0001      Posture/Postural Control   Posture Comments poor seated posture, tendency for ppt in seated       Exercises   Exercises Lumbar      Lumbar Exercises: Stretches   Figure 4 Stretch 3 reps;30 seconds    Figure 4 Stretch Limitations supine fig 4      Lumbar Exercises: Aerobic   Recumbent Bike L1 x 5 min      Lumbar Exercises:  Standing   Functional Squats 15 reps    Other Standing Lumbar Exercises EIS x 15 at wall      Lumbar Exercises: Supine   Ab Set --    Pelvic Tilt --    Bridge 20 reps      Lumbar Exercises: Prone   Other Prone Lumbar Exercises Prone on elbows x 1 min, prone press ups to elbows x 20;  PRess ups to hands x 15;     Other Prone Lumbar Exercises prone hip ext x 10 bil;       Manual Therapy   Manual Therapy Manual Traction;Joint mobilization;Soft tissue mobilization;Passive ROM    Manual therapy comments skilled palpation and monitoring of soft tissue with dry needling     Joint Mobilization PA mobs lumbar spine     Soft tissue mobilization DTM bil glutes    Manual Traction --            Trigger Point Dry Needling - 02/26/20 0001  Consent Given? Yes    Education Handout Provided Yes    Muscles Treated Back/Hip Gluteus minimus;Gluteus medius    Gluteus Minimus Response Twitch response elicited;Palpable increased muscle length   Bil   Gluteus Medius Response Twitch response elicited;Palpable increased muscle length   Bil               PT Education - 02/26/20 1210    Education Details HEP updated    Person(s) Educated Patient    Methods Explanation;Demonstration;Handout;Verbal cues;Tactile cues    Comprehension Verbalized understanding;Returned demonstration;Verbal cues required;Tactile cues required;Need further instruction            PT Short Term Goals - 02/10/20 2154      PT SHORT TERM GOAL #1   Title Pt to be independent with intiial HEP    Time 2    Period Weeks    Status New    Target Date 02/20/20             PT Long Term Goals - 02/10/20 2154      PT LONG TERM GOAL #1   Title Pt to be independent with final HEP    Time 6    Period Weeks    Status New    Target Date 03/19/20      PT LONG TERM GOAL #2   Title Pt to demo improved ability for lumbar ROM to be WNL and pain free, to improve ADLS. and IADLS.    Time 6    Period Weeks    Status  New    Target Date 03/19/20      PT LONG TERM GOAL #3   Title Pt to report decreased pain in back to 0-2/10 with daily activities, and with sitting for at least 30 min    Time 6    Period Weeks    Status New    Target Date 03/19/20      PT LONG TERM GOAL #4   Title Pt to demo ability for proper mechanics with bend, lift , squat, to improve back pain with activity    Time 6    Period Weeks    Status New    Target Date 03/19/20                 Plan - 02/26/20 1243    Clinical Impression Statement Pt with improving ROM for flex and ext today. HEP reviwed, cued for correct mechanics. Pt with tenderness and soreness in bil glutes, addressed with manual and DN, minimal soreness in back today with palpation. Does have improved LE pain. Will benefit from continued care and progrssive strengthening.    Personal Factors and Comorbidities Finances    Examination-Activity Limitations Locomotion Level;Transfers;Bend;Sit;Carry;Sleep;Stairs;Lift;Stand    Examination-Participation Restrictions Cleaning;Meal Prep;Yard Work;Community Activity;Driving    Stability/Clinical Decision Making Stable/Uncomplicated    Rehab Potential Good    PT Frequency 2x / week    PT Duration 6 weeks    PT Treatment/Interventions ADLs/Self Care Home Management;Cryotherapy;Electrical Stimulation;DME Instruction;Ultrasound;Traction;Moist Heat;Iontophoresis 4mg /ml Dexamethasone;Gait training;Stair training;Functional mobility training;Therapeutic activities;Therapeutic exercise;Balance training;Patient/family education;Neuromuscular re-education;Manual techniques;Taping;Dry needling;Spinal Manipulations;Joint Manipulations    Consulted and Agree with Plan of Care Patient           Patient will benefit from skilled therapeutic intervention in order to improve the following deficits and impairments:  Abnormal gait, Pain, Improper body mechanics, Decreased mobility, Increased muscle spasms, Decreased activity  tolerance, Decreased strength, Decreased range of motion, Impaired flexibility  Visit Diagnosis: Acute bilateral low back  pain, unspecified whether sciatica present     Problem List Patient Active Problem List   Diagnosis Date Noted  . Epidermal cyst 01/03/2019  . Back pain 01/03/2019    Sedalia Muta, PT, DPT 12:44 PM  02/26/20    [facility] [address] [place], [place], [address] Phone: [phone]   Fax:  [phone]  Name: Alexander Mullins MRN: [id number] Date of Birth: February 12, 1986

## 2020-03-17 ENCOUNTER — Ambulatory Visit (INDEPENDENT_AMBULATORY_CARE_PROVIDER_SITE_OTHER): Payer: Self-pay | Admitting: Physical Therapy

## 2020-03-17 ENCOUNTER — Telehealth: Payer: Self-pay | Admitting: Family Medicine

## 2020-03-17 ENCOUNTER — Other Ambulatory Visit: Payer: Self-pay

## 2020-03-17 ENCOUNTER — Encounter: Payer: Self-pay | Admitting: Physical Therapy

## 2020-03-17 DIAGNOSIS — M545 Low back pain, unspecified: Secondary | ICD-10-CM

## 2020-03-17 NOTE — Telephone Encounter (Signed)
Patient was seen in off 9/14 by lauren carroll for Physical therapy, total self pay cost of visit was $96

## 2020-03-17 NOTE — Therapy (Signed)
[facility] [address] [place], [place], [address] Phone: [phone]   Fax:  [phone]  Physical Therapy Treatment  Patient Details  Name: Alexander Mullins MRN: [id number] Date of Birth: 09/26/1985 Referring Provider (PT): Clementeen Graham   Encounter Date: 03/17/2020   PT End of Session - 03/17/20 1525    Visit Number 3    Number of Visits 12    Date for PT Re-Evaluation 03/19/20    Authorization Type No insurance    PT Start Time 1520    PT Stop Time 1600    PT Time Calculation (min) 40 min    Activity Tolerance Patient tolerated treatment well    Behavior During Therapy [facility] for tasks assessed/performed           Past Medical History:  Diagnosis Date  . Back pain   . Lumbar herniated disc   . Stye     Past Surgical History:  Procedure Laterality Date  . MANDIBLE FRACTURE SURGERY      There were no vitals filed for this visit.   Subjective Assessment - 03/17/20 1524    Subjective Pt states improvments, but still having pain on L, mostly. Most pain when sleeping and then in AM. No pain in LE.    Patient Stated Goals decreased pain    Currently in Pain? Yes    Pain Score 5     Pain Location Back    Pain Orientation Left;Right    Pain Descriptors / Indicators Aching    Pain Type Acute pain    Pain Onset More than a month ago    Pain Frequency Intermittent                             [facility] Adult PT Treatment/Exercise - 03/17/20 0001      Posture/Postural Control   Posture Comments poor seated posture, tendency for ppt in seated       Exercises   Exercises Lumbar      Lumbar Exercises: Stretches   Figure 4 Stretch 3 reps;30 seconds    Figure 4 Stretch Limitations supine fig 4      Lumbar Exercises: Aerobic   Recumbent Bike L2 x 6 min      Lumbar Exercises: Standing   Functional Squats 15 reps    Functional Squats Limitations education on form    Lifting 10 reps;From 12"    Lifting  Limitations no weight, education on mechanics     Other Standing Lumbar Exercises EIS x 15 at wall    Other Standing Lumbar Exercises Side glides to R x20;       Lumbar Exercises: Supine   Clam 20 reps    Clam Limitations RTB    Bridge 20 reps      Lumbar Exercises: Prone   Other Prone Lumbar Exercises prone press ups to elbows x 20;      Other Prone Lumbar Exercises prone hip ext x 10 bil;       Lumbar Exercises: Quadruped   [place]/Old Horse 20 reps      Manual Therapy   Manual Therapy Manual Traction;Joint mobilization;Soft tissue mobilization;Passive ROM    Manual therapy comments skilled palpation and monitoring of soft tissue with dry needling     Joint Mobilization PA mobs lumbar spine     Soft tissue mobilization --    Manual Traction long leg distraction for lumbar pump x 3 min;  Trigger Point Dry Needling - 03/17/20 0001    Consent Given? Yes    Education Handout Provided Previously provided    Muscles Treated Back/Hip Lumbar multifidi    Lumbar multifidi Response Palpable increased muscle length   L4/5 bil                 PT Short Term Goals - 02/10/20 2154      PT SHORT TERM GOAL #1   Title Pt to be independent with intiial HEP    Time 2    Period Weeks    Status New    Target Date 02/20/20             PT Long Term Goals - 02/10/20 2154      PT LONG TERM GOAL #1   Title Pt to be independent with final HEP    Time 6    Period Weeks    Status New    Target Date 03/19/20      PT LONG TERM GOAL #2   Title Pt to demo improved ability for lumbar ROM to be WNL and pain free, to improve ADLS. and IADLS.    Time 6    Period Weeks    Status New    Target Date 03/19/20      PT LONG TERM GOAL #3   Title Pt to report decreased pain in back to 0-2/10 with daily activities, and with sitting for at least 30 min    Time 6    Period Weeks    Status New    Target Date 03/19/20      PT LONG TERM GOAL #4   Title Pt to demo ability for  proper mechanics with bend, lift , squat, to improve back pain with activity    Time 6    Period Weeks    Status New    Target Date 03/19/20                 Plan - 03/17/20 1640    Clinical Impression Statement Pt with improving pain, and ability for functional activity. He does have continued pain, and increased pain at night and in AM. Reviewed HEP in detail. Pt doing self-pay, only able to attend PT every other week or so.    Personal Factors and Comorbidities Finances    Examination-Activity Limitations Locomotion Level;Transfers;Bend;Sit;Carry;Sleep;Stairs;Lift;Stand    Examination-Participation Restrictions Cleaning;Meal Prep;Yard Work;Community Activity;Driving    Stability/Clinical Decision Making Stable/Uncomplicated    Rehab Potential Good    PT Frequency 2x / week    PT Duration 6 weeks    PT Treatment/Interventions ADLs/Self Care Home Management;Cryotherapy;Electrical Stimulation;DME Instruction;Ultrasound;Traction;Moist Heat;Iontophoresis 4mg /ml Dexamethasone;Gait training;Stair training;Functional mobility training;Therapeutic activities;Therapeutic exercise;Balance training;Patient/family education;Neuromuscular re-education;Manual techniques;Taping;Dry needling;Spinal Manipulations;Joint Manipulations    Consulted and Agree with Plan of Care Patient           Patient will benefit from skilled therapeutic intervention in order to improve the following deficits and impairments:  Abnormal gait, Pain, Improper body mechanics, Decreased mobility, Increased muscle spasms, Decreased activity tolerance, Decreased strength, Decreased range of motion, Impaired flexibility  Visit Diagnosis: Acute bilateral low back pain, unspecified whether sciatica present     Problem List Patient Active Problem List   Diagnosis Date Noted  . Epidermal cyst 01/03/2019  . Back pain 01/03/2019   03/06/2019, PT, DPT 4:43 PM  03/17/20     [facility] [address] [place], [place], [place] Phone: [phone]   Fax:  [phone]  Name: Alexander Mullins MRN: [id number] Date of Birth: 16-Jan-1986

## 2020-03-17 NOTE — Patient Instructions (Signed)
Access Code: [email or web address] URL: [email or web address] Date: 03/17/2020 Prepared by: Sedalia Muta  Exercises Prone Press Up on Elbows - 4 x daily - 2 sets - 10 reps Supine Figure 4 Piriformis Stretch - 2 x daily - 3 reps - 30 hold Seated Piriformis Stretch with Trunk Bend - 2 x daily - 3 reps - 30 hold Cat Cow - 2 x daily - 2 sets - 10 reps Prone Hip Extension - 1 x daily - 2 sets - 10 reps Supine Bridge - 1 x daily - 2 sets - 10 reps Seated Correct Posture - 10 reps Sidelying Hip Abduction - 1 x daily - 2 sets - 10 reps Right Standing Lateral Shift Correction at Wall - Repetitions - 2 x daily - 1 sets - 10 reps

## 2020-03-30 ENCOUNTER — Encounter: Payer: Self-pay | Admitting: Physical Therapy

## 2020-03-31 ENCOUNTER — Encounter: Payer: Self-pay | Admitting: Physical Therapy

## 2020-03-31 ENCOUNTER — Ambulatory Visit (INDEPENDENT_AMBULATORY_CARE_PROVIDER_SITE_OTHER): Payer: Self-pay | Admitting: Physical Therapy

## 2020-03-31 ENCOUNTER — Other Ambulatory Visit: Payer: Self-pay

## 2020-03-31 DIAGNOSIS — M545 Low back pain, unspecified: Secondary | ICD-10-CM

## 2020-03-31 NOTE — Therapy (Addendum)
[facility] [address] [place], [place], [address] Phone: [phone]   Fax:  [phone]  Physical Therapy Treatment  Patient Details  Name: Alexander Mullins MRN: [id number] Date of Birth: 1985/07/05 Referring Provider (PT): Lynne Leader   Encounter Date: 03/31/2020   PT End of Session - 03/31/20 0937     Visit Number 4    Number of Visits 12    Date for PT Re-Evaluation 05/12/20    Authorization Type No insurance    PT Start Time 0930    PT Stop Time 1003    PT Time Calculation (min) 33 min    Activity Tolerance Patient tolerated treatment well    Behavior During Therapy [facility] for tasks assessed/performed             Past Medical History:  Diagnosis Date   Back pain    Lumbar herniated disc    Stye     Past Surgical History:  Procedure Laterality Date   MANDIBLE FRACTURE SURGERY      There were no vitals filed for this visit.   Subjective Assessment - 03/31/20 0933     Subjective Pt states improving pain. Improving with sleep, after trying new positioning. Did feel some soreness over weekend when holding kids. LE pain is better.    Patient Stated Goals decreased pain    Currently in Pain? Yes    Pain Score 3     Pain Location Back    Pain Orientation Left    Pain Descriptors / Indicators Aching    Pain Type Acute pain    Pain Onset More than a month ago    Pain Frequency Intermittent                [facility] PT Assessment - 03/31/20 0001       AROM   Lumbar Flexion wfl    Lumbar Extension wfl    Lumbar - Right Side Bend wfl    Lumbar - Left Side Bend wfl                           [facility] Adult PT Treatment/Exercise - 03/31/20 0001       Lumbar Exercises: Aerobic   Recumbent Bike L2 x 10 min       Lumbar Exercises: Standing   Other Standing Lumbar Exercises EIS x 15 at wall    Other Standing Lumbar Exercises Side glides to R x20; hip abd and ext x 20 each bil;       Lumbar  Exercises: Supine   Clam 20 reps    Clam Limitations GTB    Bridge 20 reps      Lumbar Exercises: Sidelying   Hip Abduction 20 reps;Both      Lumbar Exercises: Prone   Other Prone Lumbar Exercises reviewed for HEP    Other Prone Lumbar Exercises prone hip ext x 10 bil;       Lumbar Exercises: Quadruped   Plank 30 sec x 3;       Manual Therapy   Manual Traction long leg distraction for lumbar pump x 3 min;                       PT Short Term Goals - 03/31/20 0940       PT SHORT TERM GOAL #1   Title Pt to be independent with intiial HEP    Time 2  Period Weeks    Status Achieved    Target Date 02/20/20               PT Long Term Goals - 03/31/20 0941       PT LONG TERM GOAL #1   Title Pt to be independent with final HEP    Time 6    Period Weeks    Status Achieved      PT LONG TERM GOAL #2   Title Pt to demo improved ability for lumbar ROM to be WNL and pain free, to improve ADLS. and IADLS.    Time 6    Period Weeks    Status Achieved      PT LONG TERM GOAL #3   Title Pt to report decreased pain in back to 0-2/10 with daily activities, and with sitting for at least 30 min    Time 6    Period Weeks    Status Partially Met      PT LONG TERM GOAL #4   Title Pt to demo ability for proper mechanics with bend, lift , squat, to improve back pain with activity    Time 6    Period Weeks    Status Achieved                   Plan - 03/31/20 1615     Clinical Impression Statement Pt doing significantly better, with less pain. Has met most goals at this time, has much improved ROM, ability for flexion, and improved ability for ther ex and strengthening. Also has improved understanding of HEP and mehcnaics for lifting. Pt continues to have mild pain, mostly with sleeping. Final HEP reviewed today. Will put pt on hold at this time, pt is private pay and would like to limit visits if possible. Pt will return only if he has increased pain in  next couple weeks. Pt in agreement with plan.    Personal Factors and Comorbidities Finances    Examination-Activity Limitations Locomotion Level;Transfers;Bend;Sit;Carry;Sleep;Stairs;Lift;Stand    Examination-Participation Restrictions Cleaning;Meal Prep;Yard Work;Community Activity;Driving    Stability/Clinical Decision Making Stable/Uncomplicated    Rehab Potential Good    PT Frequency 2x / week    PT Duration 6 weeks    PT Treatment/Interventions ADLs/Self Care Home Management;Cryotherapy;Electrical Stimulation;DME Instruction;Ultrasound;Traction;Moist Heat;Iontophoresis 4mg /ml Dexamethasone;Gait training;Stair training;Functional mobility training;Therapeutic activities;Therapeutic exercise;Balance training;Patient/family education;Neuromuscular re-education;Manual techniques;Taping;Dry needling;Spinal Manipulations;Joint Manipulations    Consulted and Agree with Plan of Care Patient             Patient will benefit from skilled therapeutic intervention in order to improve the following deficits and impairments:  Abnormal gait, Pain, Improper body mechanics, Decreased mobility, Increased muscle spasms, Decreased activity tolerance, Decreased strength, Decreased range of motion, Impaired flexibility  Visit Diagnosis: Acute bilateral low back pain, unspecified whether sciatica present     Problem List Patient Active Problem List   Diagnosis Date Noted   Epidermal cyst 01/03/2019   Back pain 01/03/2019    Lyndee Hensen, PT, DPT 4:23 PM  03/31/20    [facility] [address] [place], [place], [address] Phone: [phone]   Fax:  [phone]  Name: Alexander Mullins MRN: [id number] Date of Birth: Oct 09, 1985   PHYSICAL THERAPY DISCHARGE SUMMARY  Visits from Start of Care: 4 Plan: Patient agrees to discharge.  Patient goals were met. Patient is being discharged due to hold due to meeting goals, high co-pay   Lyndee Hensen, PT, DPT 11:30 AM  04/04/22    

## 2020-05-08 ENCOUNTER — Ambulatory Visit (HOSPITAL_COMMUNITY)
Admission: EM | Admit: 2020-05-08 | Discharge: 2020-05-08 | Disposition: A | Discharge status: Home / Self Care | Payer: Self-pay | Attending: Emergency Medicine | Admitting: Emergency Medicine

## 2020-05-08 ENCOUNTER — Other Ambulatory Visit: Payer: Self-pay

## 2020-05-08 ENCOUNTER — Ambulatory Visit (HOSPITAL_COMMUNITY)
Admission: EM | Admit: 2020-05-08 | Discharge: 2020-05-08 | Disposition: A | Discharge status: Home / Self Care | Payer: Self-pay

## 2020-05-08 ENCOUNTER — Encounter (HOSPITAL_COMMUNITY): Payer: Self-pay | Admitting: Emergency Medicine

## 2020-05-08 DIAGNOSIS — S39012A Strain of muscle, fascia and tendon of lower back, initial encounter: Secondary | ICD-10-CM

## 2020-05-08 MED ORDER — CYCLOBENZAPRINE HCL 10 MG PO TABS: 10.0000 mg | ORAL_TABLET | Freq: Two times a day (BID) | ORAL | 0 refills | Status: AC | PRN

## 2020-05-08 MED ORDER — MELOXICAM 7.5 MG PO TABS: 7.5000 mg | ORAL_TABLET | Freq: Every day | ORAL | 0 refills | Status: DC

## 2020-05-08 NOTE — Discharge Instructions (Signed)
Light and regular activity as tolerated.  See exercises provided.  Heat application while active can help with muscle spasms.  Sleep with pillow under your knees.   Meloxicam daily. Don't take additional ibuprofen for aleve. Take with food.  Flexeril as needed as a muscle relaxer. May cause drowsiness. Please do not take if driving or drinking alcohol.   Continue to follow with your primary care provider as needed for persistent pain.
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
## 2020-05-11 ENCOUNTER — Other Ambulatory Visit: Payer: Self-pay

## 2020-05-11 ENCOUNTER — Encounter: Payer: Self-pay | Admitting: Family Medicine

## 2020-05-11 ENCOUNTER — Ambulatory Visit (INDEPENDENT_AMBULATORY_CARE_PROVIDER_SITE_OTHER): Payer: Self-pay | Admitting: Family Medicine

## 2020-05-11 VITALS — BP 119/70 | HR 60 | Temp 98.0°F | Wt 143.0 lb

## 2020-05-11 DIAGNOSIS — M549 Dorsalgia, unspecified: Secondary | ICD-10-CM

## 2020-05-11 MED ORDER — MELOXICAM 15 MG PO TABS: 15.0000 mg | ORAL_TABLET | Freq: Every day | ORAL | 0 refills | Status: AC

## 2020-05-11 NOTE — Patient Instructions (Signed)
It was very nice to see you today!  I would like for you to go back to see Dr. Denyse Amass.  I will send in a higher strength meloxicam.  Please continue to use heating pads.  Let us know if not improving.  Take care, Dr Jimmey Ralph  Please try these tips to maintain a healthy lifestyle:   Eat at least 3 REAL meals and 1-2 snacks per day.  Aim for no more than 5 hours between eating.  If you eat breakfast, please do so within one hour of getting up.    Each meal should contain half fruits/vegetables, one quarter protein, and one quarter carbs (no bigger than a computer mouse)   Cut down on sweet beverages. This includes juice, soda, and sweet tea.     Drink at least 1 glass of water with each meal and aim for at least 8 glasses per day   Exercise at least 150 minutes every week.
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
## 2020-05-19 ENCOUNTER — Ambulatory Visit: Payer: Self-pay | Admitting: Family Medicine
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
## 2022-03-28 ENCOUNTER — Encounter: Payer: Self-pay | Admitting: *Deleted

## 2022-06-16 ENCOUNTER — Encounter: Payer: Self-pay | Admitting: *Deleted
